# Patient Record
Sex: Male | Born: 1945 | Race: Black or African American | Hispanic: No | Smoking: Never smoker
Health system: Southern US, Community
[De-identification: ages and names within clinical notes are randomized; demographics above are authoritative.]

## PROBLEM LIST (undated history)

## (undated) DIAGNOSIS — E785 Hyperlipidemia, unspecified: Secondary | ICD-10-CM

## (undated) DIAGNOSIS — I639 Cerebral infarction, unspecified: Secondary | ICD-10-CM

## (undated) DIAGNOSIS — D649 Anemia, unspecified: Secondary | ICD-10-CM

## (undated) DIAGNOSIS — I10 Essential (primary) hypertension: Secondary | ICD-10-CM

## (undated) DIAGNOSIS — R471 Dysarthria and anarthria: Secondary | ICD-10-CM

## (undated) DIAGNOSIS — N289 Disorder of kidney and ureter, unspecified: Secondary | ICD-10-CM

## (undated) DIAGNOSIS — E871 Hypo-osmolality and hyponatremia: Secondary | ICD-10-CM

---
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
## 1999-06-09 ENCOUNTER — Emergency Department (HOSPITAL_COMMUNITY): Admission: EM | Admit: 1999-06-09 | Discharge: 1999-06-09 | Payer: Self-pay | Admitting: Emergency Medicine

## 1999-07-17 ENCOUNTER — Encounter: Admission: RE | Admit: 1999-07-17 | Discharge: 1999-08-02 | Payer: Self-pay | Admitting: Neurology

## 2004-04-12 ENCOUNTER — Inpatient Hospital Stay (HOSPITAL_COMMUNITY): Admission: EM | Admit: 2004-04-12 | Discharge: 2004-04-17 | Payer: Self-pay | Admitting: Emergency Medicine

## 2004-04-12 ENCOUNTER — Ambulatory Visit: Payer: Self-pay | Admitting: Neurosurgery

## 2004-04-17 ENCOUNTER — Ambulatory Visit: Payer: Self-pay | Admitting: Physical Medicine & Rehabilitation

## 2004-04-17 ENCOUNTER — Inpatient Hospital Stay
Admission: RE | Admit: 2004-04-17 | Discharge: 2004-04-26 | Payer: Self-pay | Admitting: Physical Medicine & Rehabilitation

## 2004-04-28 ENCOUNTER — Emergency Department (HOSPITAL_COMMUNITY): Admission: EM | Admit: 2004-04-28 | Discharge: 2004-04-28 | Payer: Self-pay

## 2004-05-31 ENCOUNTER — Ambulatory Visit: Payer: Self-pay | Admitting: Internal Medicine

## 2004-06-04 ENCOUNTER — Ambulatory Visit: Payer: Self-pay | Admitting: Internal Medicine

## 2004-06-04 ENCOUNTER — Ambulatory Visit: Payer: Self-pay | Admitting: *Deleted

## 2004-06-25 ENCOUNTER — Ambulatory Visit: Payer: Self-pay | Admitting: Internal Medicine

## 2004-08-06 ENCOUNTER — Ambulatory Visit: Payer: Self-pay | Admitting: Internal Medicine

## 2004-08-21 ENCOUNTER — Ambulatory Visit: Payer: Self-pay | Admitting: Internal Medicine

## 2004-09-18 ENCOUNTER — Ambulatory Visit: Payer: Self-pay | Admitting: Internal Medicine

## 2004-10-24 ENCOUNTER — Ambulatory Visit: Payer: Self-pay | Admitting: Internal Medicine

## 2004-11-19 ENCOUNTER — Ambulatory Visit: Payer: Self-pay | Admitting: Internal Medicine

## 2005-08-05 DIAGNOSIS — I639 Cerebral infarction, unspecified: Secondary | ICD-10-CM

## 2005-08-05 HISTORY — DX: Cerebral infarction, unspecified: I63.9

## 2006-03-07 ENCOUNTER — Ambulatory Visit: Payer: Self-pay | Admitting: Internal Medicine

## 2006-04-18 ENCOUNTER — Ambulatory Visit: Payer: Self-pay | Admitting: Internal Medicine

## 2006-05-13 ENCOUNTER — Ambulatory Visit: Payer: Self-pay | Admitting: Internal Medicine

## 2006-12-21 ENCOUNTER — Inpatient Hospital Stay (HOSPITAL_COMMUNITY): Admission: EM | Admit: 2006-12-21 | Discharge: 2006-12-31 | Payer: Self-pay | Admitting: Emergency Medicine

## 2007-01-23 ENCOUNTER — Inpatient Hospital Stay (HOSPITAL_COMMUNITY): Admission: EM | Admit: 2007-01-23 | Discharge: 2007-01-28 | Payer: Self-pay | Admitting: Emergency Medicine

## 2007-01-26 ENCOUNTER — Encounter (INDEPENDENT_AMBULATORY_CARE_PROVIDER_SITE_OTHER): Payer: Self-pay | Admitting: Orthopedic Surgery

## 2007-01-26 ENCOUNTER — Ambulatory Visit: Payer: Self-pay | Admitting: Vascular Surgery

## 2007-12-11 ENCOUNTER — Inpatient Hospital Stay (HOSPITAL_COMMUNITY): Admission: EM | Admit: 2007-12-11 | Discharge: 2007-12-19 | Payer: Self-pay | Admitting: Emergency Medicine

## 2007-12-16 ENCOUNTER — Encounter (INDEPENDENT_AMBULATORY_CARE_PROVIDER_SITE_OTHER): Payer: Self-pay | Admitting: Gastroenterology

## 2007-12-18 ENCOUNTER — Encounter (INDEPENDENT_AMBULATORY_CARE_PROVIDER_SITE_OTHER): Payer: Self-pay | Admitting: Gastroenterology

## 2010-08-26 ENCOUNTER — Encounter: Payer: Self-pay | Admitting: Physical Medicine & Rehabilitation

## 2010-12-18 NOTE — H&P (Signed)
Raymond Knight, SCHRIEBER NO.:  [id number]   MEDICAL RECORD NO.:  [id number]          PATIENT TYPE:  INP   LOCATION:  5032                         FACILITY:  [facility]   PHYSICIAN:  Della Goo, M.D. DATE OF BIRTH:  05/03/1946   DATE OF ADMISSION:  12/20/2006  DATE OF DISCHARGE:                              HISTORY & PHYSICAL   CHIEF COMPLAINT:  Right hip pain.   HISTORY OF PRESENT ILLNESS:  This is a 65 year old male who has a  history of a CVA with left-sided weakness who fell out of his wheelchair  in the evening and suffered right-sided hip pain.  The patient contacted  his daughter by cell phone and emergency medical services were notified.  The patient was taken to the hospital for evaluation. In the emergency  department, the patient had x-rays performed and found a right  intertrochanteric fracture.   PAST MEDICAL HISTORY:  1. Hypertension.  2. Cerebrovascular accident with left-sided hemiparesis.  3. Hyperlipidemia.   MEDICATIONS:  Include:  1. Clonidine 0.1 mg p.o. b.i.d.  2. Celexa 20 mg, one p.o. q.d.  3. Hydrochlorothiazide 25 mg, one p.o. q.d.  4. Lisinopril 20 mg, one p.o. q.d.  5. Lipitor 10 mg, one p.o. q.d.  6. Aspirin 325 mg, one p.o. q.d.   ALLERGIES:  THE PATIENT HAS NO KNOWN DRUG ALLERGIES.   SOCIAL HISTORY:  The patient lives at home, alone.  He is a nonsmoker,  nondrinker.   FAMILY HISTORY:  Noncontributory to this admission.   PHYSICAL EXAMINATION FINDINGS:  GENERAL:  An elderly, thin 65 year old  male in discomfort but no acute distress.  VITAL SIGNS:  Temperature 97.6, blood pressure 123/78, heart rate 100,  respirations 20, O2 saturation 100%.  HEENT EXAMINATION:  Normocephalic and atraumatic.  Pupils equal, round,  reactive to light.  Extraocular muscles are intact. Funduscopic benign.  Oropharynx is clear.  NECK:  Supple.  Full range of motion.  No thyromegaly, adenopathy, or  jugular venous distention.  CARDIOVASCULAR:   Regular rate and rhythm.  No murmurs, gallops or rubs.  LUNGS:  Clear to auscultation bilaterally.  ABDOMEN:  Positive bowel sounds, soft, nontender, nondistended.  EXTREMITIES:  Within clubbing, cyanosis, or edema.  NEUROLOGIC EXAMINATION:  The patient is alert and oriented x3.  His  speech is slurred from his previous CVA.  He has left-sided hemiparesis.  GENITOURINARY:  Deferred.  RECTAL:  Deferred.   LABORATORY STUDIES:  On admission, white blood cell count 11.6,  hemoglobin 10.6, hematocrit 31.5, platelets 217.  Sodium 137, potassium  4.2, chloride 104, BUN 25, creatinine 2.1.  MCV 88.9.  EKG reveals a  normal sinus rhythm without acute ST segment changes.   ASSESSMENT:  A 65 year old male with a previous cerebrovascular accident  being admitted with:  1. Right intertrochanteric fracture/right hip fracture.  2. Hypertension.  3. Hyperlipidemia.  4. Normocytic anemia.   PLAN:  The patient will be admitted for further evaluation and therapy.  Orthopedics will see the patient and make plans for surgical  intervention.  The patient will continue on his regular medications at  this time and  pain control therapy has been ordered.  The patient's  medications need to be verified since it appears that the patient is on  Lovastatin and Lipitor.  An anemia work up will also be ordered.  The  patient has been placed on GI and DVT prophylaxis.      Della Goo, M.D.  Electronically Signed     HJ/MEDQ  D:  12/28/2006  T:  12/28/2006  Job:  [id number]

## 2010-12-18 NOTE — H&P (Signed)
NAME:  Raymond Knight, Raymond Knight NO.:  [id number]   MEDICAL RECORD NO.:  [id number]          PATIENT TYPE:  EMS   LOCATION:  MAJO                         FACILITY:  [facility]   PHYSICIAN:  Michaelyn Barter, M.D. DATE OF BIRTH:  12/14/1945   DATE OF ADMISSION:  12/11/2007  DATE OF DISCHARGE:                              HISTORY & PHYSICAL   PRIMARY CARE PHYSICIAN:  Samara Snide, MD   CHIEF COMPLAINT:  Abnormal lab values.   HISTORY OF PRESENT ILLNESS:  Raymond Knight is a 65 year old male who  resides at [facility] nursing facility.  He has some delayed speaking  secondary to history of CVA and is therefore very difficult to obtain a  history from.  I attempted to call [facility] and spoke to a least 3 or 4  different individuals at the facility.  The  information that I was able  to gather was extremely limited according to Ms. Dartha Lodge, who identified  herself as a part-time nurse at the facility.  The patient had labs  completed today.  His hemoglobin was found to be 7.5.  His BUN was 100  and his creatinine 1.6.  He was subsequently sent to the hospital for  further evaluation of his lab values.  With regards to the patient's  overall clinical state at the facility, no individuals that I spoke with  could give me any information with regards to how the patient was doing  prior to his presentation to the hospital.   PAST MEDICAL HISTORY:  1. Left hip fracture.  2. CVA.  3. Acute blood loss anemia following surgical repair of the patient's      right hip.  4. Renal insufficiency.  5. Hyponatremia.  6. Left-sided hemiparesis secondary to the patient's CVA.  7. Dysarthria.  8. History of dysphagia following the patient's right cerebellar      hemorrhage.   PAST SURGICAL HISTORY:  1. Left hip intertrochanteric fracture open reduction internal      fixation completed by Dr. Dorene Knight on January 23, 2007.   ALLERGIES:  NO KNOWN DRUG ALLERGIES.   CURRENT  MEDICATIONS:  1. Coumadin 2.5 mg on Saturday, Coumadin 5 mg on Sunday.  2. Forteo 20 mg injection daily.  3. Os-Cal 500 mg p.o. t.i.d.  4. Vicodin 5/500 q.6 h p.r.n.  5. Celexa 20 mg daily.  6. Ferrous sulfate 325 mg one tablet b.i.d.  7. Senokot-S one tablet p.o. q.h.s.  8. Zantac 75 mg 2 tablets q.h.s.  9. Nicotine patch 21 mg q.24 h.   SOCIAL HISTORY:  Cigarettes have been denied in the past.  Alcohol  denied in the past.   FAMILY HISTORY:  Questionable.   REVIEW OF SYSTEMS:  As per HPI.   PHYSICAL EXAMINATION:  GENERAL:  The patient is awake.  He is  cooperative and in no obvious distress.  VITAL SIGNS:  Temperature is 97.4, blood pressure 106/66, heart rate  100, respirations 16, O2 sat 98%.  HEENT:  Normocephalic, atraumatic.  Anicteric.  Extraocular movements  are intact.  Oral mucosa is pink.  No thrush, no exudates.  NECK:  Supple.  No lymphadenopathy.  No JVD.  CARDIAC:  S1-S2 present.  Regular rate and rhythm.  RESPIRATORY:  No crackles or wheezes.  ABDOMEN:  Flat, soft, nontender, nondistended.  No masses palpated.  EXTREMITIES:  No leg edema.  NEUROLOGIC: The patient is alert and oriented x3.  MUSCULOSKELETAL:  Upper extremity strength 5/5.  Right leg strength is  approximately 3/5, and the patient had difficulties with regards to  moving his left lower extremity.   LABORATORY DATA:  PTT 78.  PT 44.9, INR 4.5.  Urinalysis negative.  Fecal occult blood was positive.  Sodium 141, potassium 3.4, chloride  107, CO2 23, BUN 104, creatinine 1.79, bilirubin total 0.7, alk phos  102, SGOT 23.  SGPT 35, total protein 7.6, albumin 3.1, calcium 10.6.  White blood cell count is 17, hemoglobin 8.8, hematocrit 25.6, platelets  692.   ASSESSMENT/PLAN:  1. Anemia.  The acuity of this is questionable.  The source is      questionable, although, in light of the patient's stools being      occult positive and the BUN being elevated, one has to be concerned      about the  possibility of a GI source.  Will transfuse the patient 2      units of packed red blood cells.  We will provide the patient with      Protonix.  We will consider a GI evaluation.  2. Supratherapeutic INR.  Will hold the patient's Coumadin and may      consider giving the patient vitamin K.  3. Hypokalemia.  Will supplement the patient's potassium.  4. Acute on chronic renal disease.  Will monitor the patient's BUN and      creatinine closely for now and gently hydrate the patient.  5. Hypoalbuminemia.  This may be associated protein calorie      malnutrition.  May consider consultation with one of the      dietitians.  6. History of hypertension.  The patient's blood pressure currently      appears to be stable.  7. GI prophylaxis.  Provide Protonix.  8. Code status:  A DNR out of facility form has been completed.  Will      honor the patient's DNR code status.      Michaelyn Barter, M.D.  Electronically Signed     OR/MEDQ  D:  12/11/2007  T:  12/11/2007  Job:  [id number]

## 2010-12-18 NOTE — Op Note (Signed)
NAMECRISTHIAN, Raymond Knight              ACCOUNT NO.:  [id number]   MEDICAL RECORD NO.:  [id number]          PATIENT TYPE:  INP   LOCATION:  5032                         FACILITY:  [facility]   PHYSICIAN:  Nadara Mustard, MD     DATE OF BIRTH:  [birth year]   DATE OF PROCEDURE:  12/21/2006  DATE OF DISCHARGE:                               OPERATIVE REPORT   PREOPERATIVE DIAGNOSIS:  Right intertrochanteric hip fracture.   POSTOPERATIVE DIAGNOSIS:  Right intertrochanteric hip fracture.   PROCEDURE:  IM nail, right intertrochanteric hip fracture; with a  Synthes nail 11 x 170 mm, 95 mm spiral blade, and a 36 mm distal  interlocking screw.   SURGEON.:  Nadara Mustard, MD.   ANESTHESIA:  General.   ESTIMATED BLOOD LOSS:  Minimal.   ANTIBIOTICS:  1 gram of Kefzol.   DRAINS:  None.   COMPLICATIONS:  None.   TOURNIQUET TIME:  None.   DISPOSITION:  To PACU in stable condition.   INDICATIONS FOR PROCEDURE:  The patient is a 65 year old gentleman who  fell from his walker or wheelchair sustaining a right intertrochanteric  hip fracture.  The patient was evaluated medically, felt to be safe for  surgical intervention.  He presents at this time for internal fixation  for the intertrochanteric hip fracture on the right.  Risks and benefits  were discussed with the patient including infection, neurovascular  injury, persistent pain, failure of hardware, need for additional  surgery as well as DVT and pulmonary embolus.  The patient states he  understands and wished to proceed at this time.   DESCRIPTION OF PROCEDURE:  The patient was brought to OR room #5 and  underwent a general anesthetic.  After adequate level of anesthesia  obtained, the patient was placed supine on the [facility] fracture table.  The peroneal post was padded and the right lower extremity was placed in  the boot traction.  This was padded and the left lower extremity was  placed in the dorsal lithotomy position.  The hip was  prepped using  DuraPrep and draped into a sterile field with a shower curtain.  C-arm  fluoroscopy was used for guidance.  The guidewire was inserted through  the greater trochanter, after a skin incision center-to-center down the  femoral canal.  This was then over-reamed with the 17-mm reamer.  The  nail was inserted and the C-arm was used for placement of the guidewire,  center-to-center in the femoral head and just inferior on the AP view;  this measured 95 mm.  The cortex was drilled and the 95-mm spiral blade  was placed; this was locked proximally.  Distally the guide was used and  a 36 mm locking screw was placed distally.  The guide was removed.  C-  arm fluoroscopy verified reduction on both AP and lateral planes.  The  wound was irrigated with normal saline.  The subcutaneous was closed  using 2-0 Vicryl.  The skin was closed using approximating staples.  The  wound was covered with Adaptic orthopedic sponges, ABD dressing and  Hypafix tape.  The patient was  extubated, taken to PACU in stable  condition.   PLAN:  For physical therapy, weightbearing as tolerated.  The patient  may need skilled nursing at discharge.      Nadara Mustard, MD  Electronically Signed     MVD/MEDQ  D:  12/21/2006  T:  12/21/2006  Job:  [id number]

## 2010-12-18 NOTE — Op Note (Signed)
NAME:  [name], [name] NO.:  [id number]   MEDICAL RECORD NO.:  [id number]          PATIENT TYPE:  INP   LOCATION:  5028                         FACILITY:  [facility]   PHYSICIAN:  Burnard Bunting, M.D.    DATE OF BIRTH:  26-Aug-1945   DATE OF PROCEDURE:  01/23/2007  DATE OF DISCHARGE:                               OPERATIVE REPORT   POSTOPERATIVE DIAGNOSIS:  Left hip intertrochanteric fracture.   POSTOPERATIVE DIAGNOSIS:  Left hip intertrochanteric fracture.   PROCEDURE:  Left hip intertrochanteric fracture open reduction and  internal fixation.   SURGEON:  Burnard Bunting, M.D.   ASSISTANT:  None.   ANESTHESIA:  General endotracheal.   ESTIMATED BLOOD LOSS:  100 mL.   DRAINS:  None.   INDICATION:  Raymond Knight is a 65 year old patient with a left hip  intertrochanteric fracture who is partially ambulatory who presents for  operative management after I explained the risks and benefits.   PROCEDURE IN DETAIL:  The patient was brought to the operating room  where general endotracheal anesthesia was induced.  Preoperative  antibiotics were administered.  The patient was placed on the [facility]  table with the right leg in lithotomy position with perineum well  padded.  Traction, internal rotation was applied to the left leg.  Reduction was confirmed in the AP and lateral planes and the area was  then cleaned with alcohol and Betadine and was then prepped with  DuraPrep and draped in a sterile manner.  Collier Flowers was used to cover the  operative field.  Topographical anatomy of the hip was identified  including the greater trochanter as well as the natural anteversion in  the course of the femur.  An incision was made perforating and resting  proximal to tip of the trochanter in line with the femur.  A guide pin  was placed through the tip of the trochanter past the lesser trochanter.  Correct localization was confirmed in the AP and lateral planes.  The  proximal femur  was then reamed with a starting reamer in accordance with  the preoperative templating.  A 10 x 40 IMHS nail was placed.  A lag  screw was then placed through a separate incision in the center-center  portion of the head.  A compression screw was also applied after the  traction was released.  Correct placement was confirmed in the AP and  lateral planes.  Excellent fracture  reduction was achieved.  Both incisions at this time were then irrigated  and closed using interrupted #1 Vicryl suture, interrupted 2-0 Vicryl  suture and skin staples with an impervious dressing.  The patient  tolerated the procedure well without immediate complication.      Burnard Bunting, M.D.  Electronically Signed     GSD/MEDQ  D:  01/24/2007  T:  01/24/2007  Job:  [id number]

## 2010-12-18 NOTE — Discharge Summary (Signed)
NAMESHONTEZ, SERMON NO.:  [id number]   MEDICAL RECORD NO.:  [id number]          PATIENT TYPE:  INP   LOCATION:  5028                         FACILITY:  [facility]   PHYSICIAN:  Burnard Bunting, M.D.    DATE OF BIRTH:  11-25-1945   DATE OF ADMISSION:  01/23/2007  DATE OF DISCHARGE:  01/29/2007                               DISCHARGE SUMMARY   DISCHARGE DIAGNOSES:  1. Left hip fracture.  2. History of cerebrovascular accident, getting paralysis and      hypertension.   OPERATIVE PROCEDURES:  Open reduction internal fixation, left hip  fracture; performed January 24, 2007.   HOSPITAL COURSE:  Raymond Knight is a partially ambulatory 65 year old  patient who sustained a right hip fracture about a month ago, and  sustained a left hip fracture the day he returned home from  rehabilitation.  The patient was taken to OR on January 24, 2007, at which  time he underwent open reduction internal fixation of the left hip  fracture.  He tolerated the procedure well without immediate  complication.  He was started on aspirin and mechanical prophylaxis for  DVT prophylaxis.  Ultrasound today showed bilateral lower extremity DVT.   On postoperative day #2, the patient is moved to physical therapy.  Touchdown weightbearing on the left lower extremity.  He is discharged  in good condition on January 29, 2007.   DISCHARGE MEDICATIONS:  1. Clonidine 0.1 mg p.o. b.i.d.  2. Celexa 20 mg p.o. daily.  3. Hydrochlorothiazide 25 mg p.o. daily.  4. Lisinopril 20 mg p.o. daily.  5. Simvastatin 20 mg p.o. daily.  6. Protonix 40 mg p.o. daily.  7. Aspirin 325 mg p.o. daily.  8. Percocet one p.o. q. 3-4 h. p.r.n. pain.   FOLLOWUP:  He will follow up with me in 7-10 days for suture removal.      Burnard Bunting, M.D.  Electronically Signed     GSD/MEDQ  D:  01/28/2007  T:  01/28/2007  Job:  [id number]

## 2010-12-18 NOTE — Consult Note (Signed)
NAME:  Raymond Knight, Raymond Knight NO.:  [id number]   MEDICAL RECORD NO.:  [id number]          PATIENT TYPE:  INP   LOCATION:  5123                         FACILITY:  [facility]   PHYSICIAN:  Petra Kuba, M.D.    DATE OF BIRTH:  10/29/1945   DATE OF CONSULTATION:  12/14/2007  DATE OF DISCHARGE:                                 CONSULTATION   We were asked to see Mr. Dorinda Hill today by Dr. Arthor Captain of [facility], team  D.   HISTORY OF PRESENT ILLNESS:  This is a 65 year old male who was admitted  with a hemoglobin of 7.5 and a BUN of 100.  He was on Coumadin at the  time of admission.  The patient reports no heartburn, no abdominal pain,  no melena, and no hematochezia.  He also tells me he does not want  endoscopy.  He also reports no NSAIDs.  The patient does have dysarthria  from a previous stroke and seems to answer questions very appropriately,  but it is still somewhat questionable if he fully comprehends what I am  asking him.  The patient does not currently want the meal at his  bedside.  I would be interested to know if he has lost weight recently.  He had an acute blood loss anemia episode just after the repair of his  left hip approximately 1 year ago.  His baseline hemoglobin appears to  have been about 10, MCV value is now 90.  His INR was 4.8 on admission.   PAST MEDICAL HISTORY:  Significant for both left and right hip fractures  with repair of the left hip, CVA with left-sided hemiparesis and  dysarthria as residual, history of acute blood loss anemia after right  hip repair, renal insufficiency, history of dysphagia after stroke, and  hypertension.   CURRENT MEDICATIONS:  Include Coumadin, Forteo, Os-Cal, Vicodin, Celexa,  ferrous sulfate, Senokot, Zantac, and nicotine patch.   ALLERGIES:  He has an allergy to CONTRAST MEDIA.   REVIEW OF SYSTEMS:  Significant for increased falling at the skilled  nursing facility.   SOCIAL HISTORY:  Negative for alcohol and  tobacco.   PHYSICAL EXAM:  GENERAL:  He is awake and appropriate.  VITAL SIGNS:  Temperature 98.4, pulse 91, respirations 20, and blood  pressure is 136/86.  HEART:  Regular rate and rhythm.  ABDOMEN:  Soft, nontender, and nondistended with active bowel sounds.   LABORATORY DATA:  BMET shows a potassium of 3.2, BUN 27, and creatinine  1.09.  White count is down from 17 to 14.4, hemoglobin is 10.5. 3.  The  MCV value of 98.3 is taken from his admission hemoglobin of 8.8,  hematocrit 31.3, and platelets 518,000.  PT 18.6, INR 1.5.  The patient  was guaiac positive on admission.   ASSESSMENT:  Dr. Vida Rigger has seen and examined the patient, collected  history and reviewed his chart.  His impression is that we really would  like more history, possibly from family members, although at this point  we are unable to reach them by the contact numbers in E-chart.  He  had a  supratherapeutic INR on admission.  He reports that he does not want  endoscopy.  He definitively needs a gastrointestinal workup including  endoscopy and  colonoscopy, can do these as an outpatient.  If we were able to  eliminate the Coumadin, his risk for gastrointestinal bleeding would  certainly decrease.  We will recommend continued PPI therapy with  continued stool softeners and attempt to do a gastrointestinal workup as  an outpatient.      Stephani Police, PA    ______________________________  Petra Kuba, M.D.    MLY/MEDQ  D:  12/14/2007  T:  12/15/2007  Job:  [id number]   cc:   Petra Kuba, M.D.  Michelene Gardener, MD  [facility]

## 2010-12-18 NOTE — Discharge Summary (Signed)
Raymond Knight, Raymond Knight              ACCOUNT NO.:  [id number]   MEDICAL RECORD NO.:  [id number]          PATIENT TYPE:  INP   LOCATION:  5123                         FACILITY:  [facility]   PHYSICIAN:  Isidor Holts, M.D.  DATE OF BIRTH:  05-23-1946   DATE OF ADMISSION:  12/10/2007  DATE OF DISCHARGE:  12/19/2007                               DISCHARGE SUMMARY   PRIMARY CARE PHYSICIAN:  [name], MD, [facility].   PRIMARY ORTHOPEDIC SURGEON:  Burnard Bunting, MD.   DISCHARGE DIAGNOSES:  1. Pelvic fracture.  2. Anemia likely secondary to acute blood loss and gastrointestinal      source, required blood transfusion.  3. Colon polyps revealed on colonoscopy Dec 18, 2007.  4. Dehydration/acute renal failure.  5. Hypernatremia.  6. Hypertension.  7. Aspiration pneumonia.  8. History of cerebrovascular accident with left hemiparesis,      dysarthria and dysphagia.  9. Coagulopathy secondary to Coumadin anticoagulation.   DISCHARGE MEDICATIONS:  1. Os-Cal 500 mg p.o. t.i.d.  2. Vicodin 5/500 mg one p.o. p.r.n. q.6 h.  3. Celexa 20 mg p.o. daily.  4. Ferrous sulfate 325 mg p.o. b.i.d.  5. Senokot-S one p.o. nightly.  6. Zantac 150 mg p.o. nightly.  7. Nicoderm CQ patch 21 mg/q.12 h., one patch to skin daily.  8. Ensure vanilla pudding 130 mL p.o. t.i.d.  9. Thick-It instant food thickener 227 g p.o. p.r.n.  10.Avelox 400 mg p.o. daily to be completed on Dec 23, 2007.   Note: Forteo injection and Coumadin have been discontinued.   PROCEDURES:  1. Chest x-ray dated Dec 11, 2007.  This showed the right greater than      left bibasilar airspace disease, most likely atelectasis.  Early      right lower lobe pneumonia cannot be excluded.  Underlying COPD.  2. X-ray hips, Dec 11, 2007.  This showed displaced left acetabular      fracture with extension through the left iliac bone.  3. Modified barium swallow examination dated Dec 14, 2007.  This      showed  mild oral, moderate pharyngeal dysphagia, aspiration with      thin liquid during swallow occurred due to decreased laryngeal      elevation and epiglottic closure.  The patient had immediate      spontaneous cough that did not clear from below vocal cords.      Delayed sensation resulted in late initiation of swallow to      valleculae and piriforms.  Trace vallecula and piriform sinus      residue caused by reduced tongue base retraction and laryngeal      elevation.  Oral dysphagia his characterized by decreased cohesion      and transit.  The patient's aspiration risk is moderate.      Recommendations:  D-3 dysphagia diet with nectar-thick liquids.  4. Chest x-ray dated Dec 16, 2007.  This showed appreciable      improvement in aeration at both bases with some mild residual      atelectasis bilaterally and some residual peribronchial thickening  at the right base.  The patient may have some bronchiectasis at the      right base.  5. Abdominal x-ray dated Dec 17, 2007.  This showed NG tube curled in      the fundus of the stomach.  6. Upper GI endoscopy dated Dec 16, 2007.  This showed tiny hiatal      hernia, tiny bulb polyp status post biopsy.  Otherwise normal EGD      without signs of bleeding.  7. Colonoscopy dated Dec 18, 2007.  This showed colon polyps.  These      were biopsied.   CONSULTATIONS:  Petra Kuba, MD, gastroenterologist.   ADMISSION HISTORY:  As in H&P notes of Dec 11, 2007, dictated by Dr.  Michaelyn Barter.  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  He was admitted for further  evaluation,  investigation and management.   CLINICAL COURSE:  1. Severe anemia.  The patient pre-admission, was on iron      supplementation for chronic anemia.  He now presents with a      reported hemoglobin of 7.5.  This was rechecked in the emergency      department, was found be 8.8 with a hematocrit of 25.6 and an MCV      of 90.3.  X-rays done as part of initial evaluation demonstrated a      new pelvic fracture.  Differential diagnostic considerations      included acute blood loss anemia versus possible chronic GI blood      loss.  The patient's anemia was addressed with transfusion of      packed red blood cells, resulting in a satisfactory bump in post-      transfusion hemoglobin to 11.5 on Dec 11, 2007.  The patient was      noted to have a coagulopathy with INR of 4.5.  This was addressed      with vitamin K supplementation.  GI consultation was called, which      was kindly provided by Dr. Vida Rigger.  For details of his      consultation, refer to consultation notes of Dec 14, 2007.  The      patient underwent upper GI endoscopy on Dec 16, 2007.  For      findings, refer to procedure list above.  No obvious source of      bleeding was seen although there was a tiny bulb polyp which was      biopsied.  He underwent lower GI endoscopy i.e., colonoscopy, on      Dec 18, 2007.  Colonic polyps were seen and were extracted.  Per      Dr. Vida Rigger, no further GI workup is indicated.  The patient      continues on iron supplementation.   1. Coagulopathy.  The patient pre-admission, was on Coumadin      anticoagulation.  Although extensive review of his electronic      medical records was done, no precise indication could be elicited.      He does have a propensity for falls and is considered at high fall  risk.  Coumadin has therefore been discontinued, particularly      against a background of his recent severe anemia.  INR was 4.5 at      the time of presentation.   This was successfully addressed with      vitamin K1 supplementation, and we are pleased to note that as of      Dec 18, 2007, INR was reasonable at 1.7.   1. Pelvic fracture.  As part of initial workup, the patient underwent      an x-ray of the pelvis on Dec 11, 2007.  For details, refer to      procedure list above.  However, this confirmed a displaced left      acetabular fracture with extension through the left iliac bone.      The patient was managed with weightbearing as tolerated, as well as      analgesic medication.  He was evaluated by PT/OT during the course      of this hospitalization and appropriate recommendations made.  We      recommend that the patient follow up with his primary orthopedic      surgeon, Dr. Dorene Grebe, following discharge.  He will likely      require repeat pelvic x-ray in approximately 2 weeks to document      interval union.   1. Dysphagia.  The patient has a known history of previous CVAs,      dysarthria and dysphagia.  He was evaluated by speech pathologist      during the course of this hospitalization, was found to have      oropharyngeal dysphagia and has been recommended D-3 dysphagia diet      with nectar-thick liquids.   1. Dehydration/acute renal failure/hypernatremia.  The patient at the      time of presentation was found to be dehydrated with markedly      elevated BUN/creatinine ratio.  Repeat laboratory testing confirmed      a BUN of 104 with a creatinine of 1.79.  This was deemed secondary      to dehydration and volume depletion.  He was managed with      intravenous fluid hydration with satisfactory clinical response.      We note that as of Dec 18, 2007, his renal indices have normalized      to a BUN of 18 and creatinine of 1.01.  Intravenous fluids have      therefore been discontinued.   1. Hypernatremia.  The patient did experience a hypernatremia of 151      on Dec 13, 2007.  This responded to hypotonic fluids, and we  are      pleased to note that as of Dec 18, 2007, serum sodium had      normalized at 137.   1. Aspiration pneumonia.  This is likely secondary to the patient's      dysphagia.  He was managed with a combination of Avelox and      Clindamycin.  Chest x-ray of Dec 16, 2007, confirmed interval      improvement of air space disease.  We were subsequently able to      transition him to oral Avelox on Dec 18, 2007, to complete a 10-day      course of treatment on Dec 23, 2007.  Throughout his      hospitalization, he was apyrexial.   1. Hypertension.  The patient remained normotensive during the course  of his hospitalization   DISPOSITION:  The patient was on Dec 18, 2007, considered sufficiently  clinically recovered and stable to be discharged back to the skilled  nursing facility on Dec 19, 2007, provided no acute problems arise in  the interim.   DIET:  Heart-healthy.   ACTIVITY:  As tolerated and per PT/OT.   WOUND CARE:  Not applicable.   FOLLOW-UP INSTRUCTIONS:  The patient is to follow up routinely with his  primary MD, Dr. Leodis Rains of [facility], per  prior scheduled appointment, but certainly within 1-2 weeks of  discharge.  In addition he is recommended to follow up with Dr. Dorene Grebe, his orthopedic surgeon, within 1-2 weeks to follow up his pelvic  fracture.   SPECIAL INSTRUCTIONS:  Continue PT/OT/rehab in the nursing facility  environment.  We also recommend x-ray of the pelvis in 2 weeks to  document interval union, or otherwise as indicated by orthopedic  surgeon, Dr. Dorene Grebe.  Of note, the patient is considered no longer a  candidate for Coumadin anticoagulation, secondary to recent to profound  anemia as well as high risk of falls.      Isidor Holts, M.D.  Electronically Signed     CO/MEDQ  D:  12/18/2007  T:  12/18/2007  Job:  [id number]   cc:   Samara Snide, MD  G. Dorene Grebe, M.D.

## 2010-12-18 NOTE — Discharge Summary (Signed)
Raymond Knight, Raymond Knight NO.:  [id number]   MEDICAL RECORD NO.:  [id number]          PATIENT TYPE:  INP   LOCATION:  5032                         FACILITY:  [facility]   PHYSICIAN:  Michaelyn Barter, M.D. DATE OF BIRTH:  [birth year]   DATE OF ADMISSION:  12/20/2006  DATE OF DISCHARGE:  12/31/2006                               DISCHARGE SUMMARY   FINAL DIAGNOSES:  1. Right intertrochanteric hip fracture.  2. Acute blood loss anemia.  3. Renal insufficiency.  4. Hyponatremia.   SECONDARY DIAGNOSIS:  History of cerebrovascular accident.   PROCEDURES:  1. Right intertrochanteric fracture with intramedullary nail placement      completed by Nadara Mustard, MD, on Dec 21, 2006.  2. Transfusion of 3 units of packed red blood cells.  3. X-ray of the right hip.  4. Portable chest x-ray completed Dec 23, 2006.  5. Portable chest x-ray completed Dec 21, 2006.  6. X-ray of the right femur completed Dec 21, 2006.   HISTORY OF PRESENT ILLNESS:  Raymond Knight is a 65 year old gentleman who  indicated that he fell out of his wheelchair on the evening of  admission, following which time he developed right hip pain.  The  patient was brought to the hospital for further evaluation.   For past medical history, please see that dictated by Della Goo,  M.D.   HOSPITAL COURSE:  Problem 1.  RIGHT INTERTROCHANTERIC HIP FRACTURE:  X-rays of the  patient's right femur were completed on Dec 21, 2006.  They revealed a  right femoral intertrochanteric fracture.  Orthopedic surgery was  consulted and on Dec 21, 2006, Dr. Aldean Baker performed an IM nail  procedure to repair the right intertrochanteric hip fracture.  The  patient appears to have tolerated the procedure well.  Over the past  several days of his hospitalization he has only complained of minimal  discomfort.   Problem 2.  ACUTE BLOOD LOSS ANEMIA:  The patient had a hemoglobin of  10.6 with a hematocrit of 31.5 at the time  of his admission.  However,  by May 20 his hemoglobin was noted to have dipped to 6.7.  He received  one unit of packed red blood cells.  By Dec 24, 2006, his hemoglobin was  noted to be 7.6.  For this he received 2 additional units of packed red  blood cells.  The patient's hemoglobin responded to the blood  transfusion.  By May 27 his hemoglobin was up to 9.2, which is slightly  below his baseline.  He was otherwise asymptomatic.   Problem 3.  SEVERE IRON DEFICIENCY:  The patient had an anemia panel  completed on Dec 21, 2006.  His iron level was noted to be less than 10.  The patient was started on ferrous sulfate 325 mg p.o. b.i.d.   Problem 4.  HYPERTENSION:  This has been relatively stable throughout  the course of the patient's hospitalization.   Condition at the time of discharge appears to be stable.  Currently the  patient has no complaints.  His vitals:  His temperature is 98.9, heart  rate 68, respirations 18, blood pressure 133/87, O2 saturation 98% on  room air.  The patient will be discharged from the hospital.   His medications at the time of discharge are:  1. Ecotrin 325 mg p.o. daily.  2. Celexa 20 mg p.o. daily.  3. Clonidine 0.1 mg p.o. b.i.d.  4. Ferrous sulfate 325 mg p.o. b.i.d.  5. Lopressor 12.5 mg p.o. b.i.d.  6. Protonix 40 mg p.o. daily.      Michaelyn Barter, M.D.  Electronically Signed     OR/MEDQ  D:  12/31/2006  T:  12/31/2006  Job:  [id number]

## 2010-12-18 NOTE — Op Note (Signed)
NAME:  Raymond Knight, Raymond Knight              ACCOUNT NO.:  [id number]   MEDICAL RECORD NO.:  [id number]          PATIENT TYPE:  INP   LOCATION:  5123                         FACILITY:  [facility]   PHYSICIAN:  Petra Kuba, M.D.    DATE OF BIRTH:  [birth year]   DATE OF PROCEDURE:  12/18/2007  DATE OF DISCHARGE:                               OPERATIVE REPORT   PROCEDURE:  Colonoscopy with biopsy.   INDICATION:  Guaiac-positive anemia, nondiagnostic endo.  Consent was  signed after risks, benefits, and management options were thoroughly  discussed with myself and the PA prior to any sedation.   MEDICINES USED:  Fentanyl 50 mcg and Versed 4 mg.   PROCEDURE IN DETAIL:  Rectal inspection confirmed small external  hemorrhoids.  Digital exam was negative.  Video pediatric colonoscope  was inserted and fairly easily advanced from the colon to the cecum.  No  signs of bleeding were seen.  We did need some abdominal pressure, but  no position changes.  Cecum was identified by the appendiceal orifice  and the ileocecal valve.  The scope was slowly withdrawn.  The cecum in  the ascending were normal.  The prep was adequate.  There was some  liquid stool that required washing and suctioning and in the more  proximal transverse, a small pedunculated polyp was seen.  Snare  electrocautery applied and followed with suction through the scope and  collected in the trap.  Two other possible tiny transverse polyps were  seen.  However, due to increased spasm, had difficulty getting the scope  in position to biopsy them, and based on other medical problems and the  tiny nature of these, we elected to withdrawal.  No other abnormalities  were seen and no signs of bleeding as we slowly withdrew back to the  rectum.  In the rectum, two tiny hyperplastic-appearing polyps were seen  in cold biopsy and put in the same container.  Anorectal pull-through  and retroflexion revealed some tiny hemorrhoids.  Scope was drained  and  readvanced towards the left side, the colon air was suctioned and scope  was removed.  The patient tolerated the procedure well.  There was no  obvious immediate complications.   ENDOSCOPIC DIAGNOSES:  1. Internal and external small hemorrhoids.  2. Two rectal tiny polyps with cold biopsy.  3. Proximal small transverse polyps snared.  4. Two tiny transverse polyps, not biopsied secondary to seen but      unable to re-find and rebiopsy due to an increased spasm.  5. Otherwise, within normal limits to the cecum.   PLAN:  Await pathology, if doing well medically or stable.  Consider  repeat colonoscopy in 5 years.  Happy to see back p.r.n.  Slowly  advanced diet.  No further GI workup at this time.          ______________________________  Petra Kuba, M.D.    MEM/MEDQ  D:  12/18/2007  T:  12/19/2007  Job:  [id number]   cc:   [facility] SERVICE

## 2010-12-18 NOTE — Op Note (Signed)
NAME:  Raymond Knight, Raymond Knight              ACCOUNT NO.:  [id number]   MEDICAL RECORD NO.:  [id number]          PATIENT TYPE:  INP   LOCATION:  5123                         FACILITY:  [facility]   PHYSICIAN:  Petra Kuba, M.D.    DATE OF BIRTH:  October 10, 1945   DATE OF PROCEDURE:  12/16/2007  DATE OF DISCHARGE:                               OPERATIVE REPORT   PROCEDURE:  EGD.   INDICATIONS:  Anemia, guaiac positivity.  Consent was signed after  risks, benefits, methods, and options thoroughly discussed with the  patient yesterday and today prior to sedation.   MEDICINES USED:  1. Fentanyl 75 mcg.  2. Versed 7 mg.   PROCEDURE:  Video endoscope was inserted by direct vision.  The  esophagus was normal.  He did have a tiny hiatal hernia.  Scope passed  in the stomach advanced through a normal antrum, normal pylorus into the  duodenal bulb where a tiny polyp was seen and advanced around the C-loop  to a normal second portion of the duodenum.  No blood was seen distally.  Scope was slowly withdrawn back to the bulb, and other than the tiny  polyp, no other abnormalities were seen there.  Two biopsies were  obtained of the polyp.  Scope was withdrawn back to the stomach and  retroflexed.  Angularis, cardia, fundus, lesser and greater curves were  evaluated on retroflex and straight visualization which was normal.  Air  was suctioned.  Scope was slowly withdrawn.  A good look at the  esophagus again confirmed above normal findings.  Scope was removed.  The patient tolerated the procedure well.  There was no obvious  immediate complication.   ENDOSCOPIC DIAGNOSES:  1. Tiny hiatal hernia.  2. Tiny bulb polyp status post biopsy.  3. Otherwise normal EGD without signs of bleeding.   PLAN:  Await pathology.  We will need to discuss colon with the patient.  It will be difficult to the prep him.  Probably, he will need an NG tube  since I do not know how he tolerates clear liquids, never on the  laxatives.  We will discuss that when he wakes up later today.           ______________________________  Petra Kuba, M.D.     MEM/MEDQ  D:  12/16/2007  T:  12/16/2007  Job:  [id number]

## 2010-12-18 NOTE — Consult Note (Signed)
NAME:  Raymond Knight, Raymond Knight NO.:  [id number]   MEDICAL RECORD NO.:  [id number]          PATIENT TYPE:  INP   LOCATION:  5028                         FACILITY:  [facility]   PHYSICIAN:  Burnard Bunting, M.D.    DATE OF BIRTH:  1946/06/29   DATE OF CONSULTATION:  01/24/2007  DATE OF DISCHARGE:                                 CONSULTATION   CHIEF COMPLAINT:  Left hip pain.   HISTORY OF PRESENT ILLNESS:  Raymond Knight is a 65 year old patient  with left hip pain.  He is actually 3 weeks status post right hip  fracture, treated with open reduction internal fixation.  He had an  acute fall today while he was at the nursing home while he was  attempting to ambulate.  He was using a walker at the time.  He denies  any loss of consciousness, or any other orthopedic complaints.  His  granddaughter is with him here today, and she is providing the history.   PAST MEDICAL HISTORY:  Notable for CVA, giving him left-sided  hemiparalysis, as well as hypertension.  He is a nonsmoker.   SOCIAL HISTORY:  Noncontributory.   ALLERGIES:  The patient has no known drug allergies.   MEDICATIONS:  Include:  1. Clonidine 0.1 mg p.o. b.i.d.  2. Celexa 20 mg p.o. daily.  3. Hydrochlorothiazide 25 mg p.o. daily.  4. Lisinopril 20 mg p.o. daily.  5. Lipitor 10 mg p.o. daily.  6. ASA 325 mg p.o. daily.  7. Protonix 40 mg p.o. daily.   PHYSICAL EXAMINATION:  VITAL SIGNS:  His blood pressure is 141/84, pulse  is 84, respirations 20, O2 sat is 99% on room air, temperature is 98.1.  CHEST:  Clear to auscultation.  HEART:  Regular rate and rhythm.  ABDOMINAL:  Benign.  SKIN:  He has no lymphadenopathy.  No mass or other skin changes noted  in the upper or lower extremities.  MUSCULOSKELETAL:  His left hip has some pain with internal and external  rotation.  He has very little voluntary movement of the left side.  DP  pulse 2+/4.  He has no real grinding or crepitus with passive range of  motion of  bilateral wrists, elbows and shoulders.  There is no knee  effusion bilaterally.  His right hip has minimal pain with internal and  external rotation.   Radiographs do show an acute left intertrochanteric fracture.  Chest x-  ray shows no acute disease.  EKG is pending.  A sodium and potassium 131  and 4.7, BUN and creatinine is 14 and 1.05, glucose is 110.  White count  is 11, hematocrit is 31, platelets 311.   IMPRESSION:  Left hip intertrochanteric fracture with mild hyponatremia.   PLAN:  Open reduction internal fixation risks and benefits were  discussed with the patient and his daughter.  All questions answered.      Burnard Bunting, M.D.  Electronically Signed     GSD/MEDQ  D:  01/23/2007  T:  01/24/2007  Job:  [id number]

## 2010-12-21 NOTE — H&P (Signed)
NAME:  Raymond Knight, Raymond Knight NO.:  [id number]   MEDICAL RECORD NO.:  [id number]                   PATIENT TYPE:  INP   LOCATION:  1827                                 FACILITY:  [facility]   PHYSICIAN:  Payton Doughty, M.D.                   DATE OF BIRTH:  26-Nov-1945   DATE OF ADMISSION:  04/12/2004  DATE OF DISCHARGE:                                HISTORY & PHYSICAL   ADMITTING DIAGNOSIS:  Right cerebellar hemorrhage.   HISTORY:  [age]-year-old right-handed black gentleman with the onset of  slurred speech yesterday and was brought to the emergency room today by  family.  CT showed a right cerebellar hemorrhage, approximately 3 x 3 cm,  with some compression of the fourth ventricle, but not obstruction of it and  nor is there any hydrocephalus.  He has been awake and alert, and just  states he feels bad.   PAST MEDICAL HISTORY:  Medical history is remarkable for a stroke five years  ago.   PAST SURGICAL HISTORY:  None.   ALLERGIES:  None.   MEDICATIONS:  None.  He is suppose to take an antihypertensive, but he does  not.   SOCIAL HISTORY:  There is no drinking, but he does smoke.   FAMILY HISTORY:  Family history is negative.   REVIEW OF SYSTEMS:  The patient states nothing   PHYSICAL EXAMINATION:  GENERAL APPEARANCE:  The patient is awake and feels  bad.  VITAL SIGNS: Blood pressure is 190/100 and pulse is in the 80s.  HEENT:  Exam is within normal limits.  He does have arcus senilis.  NECK:  Neck is supple with no lymphadenopathy.  CHEST:  Chest has diffuse crackles.  HEART:  Cardiac examination is regular rate and rhythm without a murmur.  ABDOMEN:  Abdomen is soft and has positive bowel sounds.  EXTREMITIES:  Extremities are without clubbing or cyanosis.  He has positive  pulses throughout.  GENITOURINARY:  GU exam is deferred.  EXTREMITIES:  Peripheral pulses are good.  NEUROLOGIC EXAMINATION:  Neurologically he is alert, awake and  oriented  times three.  Pupils equal, round and react to light.  Extraocular movements  are intact.  Facial movements and sensation appear to be intact.  Palate  elevates symmetrically, but he is dysarthric.  Shoulder shrug is equal.  Motor exam shows full strength without pronator draft; however, he is  dysmetria in both the right upper and lower extremities on finger-to-nose,  pointing and heel-to-shin.  Reflexes are 1 throughout, save n the ankles,  which are absent.  His toes are downgoing bilaterally.   LABORATORY DATA:  CT is as above.   CLINICAL IMPRESSION:  Cerebellar hemorrhage without hydrocephalus.  This is  likely hypertensive in nature.   1.  The patient needs to have his blood pressure controlled.  2.  The patient will remain in the hospital ________ with  him.  3.  Rescan him in the morning.   I think he is as bad as he is going to get and will probably recover from  here.                                                Payton Doughty, M.D.    MWR/MEDQ  D:  04/12/2004  T:  04/13/2004  Job:  [id number]

## 2010-12-21 NOTE — Discharge Summary (Signed)
NAMEMAKSIM, PEREGOY              ACCOUNT NO.:  [id number]   MEDICAL RECORD NO.:  [id number]          PATIENT TYPE:  INP   LOCATION:  3109                         FACILITY:  [facility]   PHYSICIAN:  Payton Doughty, M.D.      DATE OF BIRTH:  Sep 09, 1945   DATE OF ADMISSION:  04/12/2004  DATE OF DISCHARGE:  04/17/2004                                 DISCHARGE SUMMARY   ADMITTING DIAGNOSES:  Cerebellar hemorrhage.   A 65 year old gentleman whose history and physical is recounted in the  chart.  Brought to the emergency room September 8 with CT showed right  cerebellar hemorrhage.  No obstruction and no hydrocephalus.  Medical  history is remarkable for a stroke.   ALLERGIES:  None.   MEDICATIONS:  None.   SOCIAL HISTORY:  Smokes.   PHYSICAL EXAMINATION:  GENERAL:  Intact.  NEUROLOGIC:  Intact save for right upper extremity dysmetria and mild  dysarthria.   He was admitted to the ICU and observed.  He had really no change.  There  was no hydrocephalus.  He was mobilized in physical therapy and occupational  therapy who felt he would benefit from a stay on the rehabilitation service.  He was transferred to the rehabilitation service April 17, 2004 still  slightly dysmetric on the right.  His follow-up will be in the [facility] office on a p.r.n. basis.       MWR/MEDQ  D:  07/10/2004  T:  07/10/2004  Job:  [id number]

## 2010-12-21 NOTE — Discharge Summary (Signed)
Raymond Knight, Raymond Knight NO.:  [id number]   MEDICAL RECORD NO.:  [id number]          PATIENT TYPE:  ORB   LOCATION:  4524                         FACILITY:  [facility]   PHYSICIAN:  Ellwood Dense, M.D.   DATE OF BIRTH:  1946-03-12   DATE OF ADMISSION:  04/17/2004  DATE OF DISCHARGE:  04/26/2004                                 DISCHARGE SUMMARY   DISCHARGE DIAGNOSES:  1.  Right cerebellar hemorrhage.  2.  Hypertension with poor compliance.  3.  Renal insufficiency, improved.  4.  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  5.  History of cerebrovascular accident five years ago.   HISTORY OF PRESENT ILLNESS:  This is a 65 year old right-handed black male  admitted September 8 with slurred speech, blood pressure 190, diastolic 100.  Cranial CT scan with right cerebellar hemorrhage 3 x 3 cm with some  compression of the fourth ventricle without hydrocephalus.  Neurosurgery,  Dr. Trey Sailors.  Conservative care advised.  Follow-up cranial CT scan with  stable large cerebellar hemorrhage.  Blood pressure still with fluctuating  variables with clonidine and lisinopril.  Mild hypokalemia 2.9 and  supplemented.  He was admitted for a comprehensive rehabilitation program.   PAST MEDICAL HISTORY:  See discharge diagnoses.   ALLERGIES:  None.   HABITS:  No alcohol.  Remote smoker.   MEDICATIONS ON ADMISSION:  No medication prior to admission with noted poor  compliance for his hypertension in the past.   SOCIAL HISTORY:  Lives alone in [place].  One-level home.  No steps to  entry.  He is divorced.   HOSPITAL COURSE:  Patient with slow progressive gains while on  rehabilitation services with therapies initiated daily.  The following  issues were followed during patient's rehabilitation course.  Pertaining to  Raymond Knight's right cerebellar hemorrhage, remained stable with follow-up  cranial CT scans, conservative care per Dr. Trey Sailors of neurosurgery.  He  had some  mild slurred speech, but was fully intelligible.  Followed by  speech therapy for dysphagia after cerebellar hemorrhage initially on a  regular diet with nectar thick liquids.  After follow-up per speech therapy,  strong encourage for fluids.  Poor overall intake.  Initial admit  laboratories showed renal function to be within normal limits with BUN 7,  creatinine 1.0 on September 14.  Routine follow-up laboratories September 21  with dramatic increased creatinine of 4.0.  Thus, he was placed on half  normal IV fluids.  September 21 creatinine improved to 2.7.  In the interim  a modified barium swallow was completed.  He was now advanced to a regular  diet with full thin liquids.  His overall intake had greatly improved once  his diet was advanced.  His intravenous fluids were discontinued September  22 with plans for placement for skilled nursing facility.  Blood pressures  monitored with clonidine and lisinopril.  He had no bowel or bladder  disturbances.  He had completed an early course of Avelox for pneumonia  while in the acute side of the hospital.  This had been since completed.  A  follow-up chest  x-ray September 21 showed improved right lower lobe  pneumonia.  Overall for his functional status he was supervision bed  mobility and transfers, minimal guard ambulation with a rolling walker  needing QCUES for safety.  Due to the fact that patient lived alone with  limited assistance, it was felt a skilled nursing facility was needed with a  bed becoming available on September 22 and discharge taking place at that  time.   Latest laboratories showed a hemoglobin 12.6, hematocrit 37.2, platelets  344,000, WBC of 7000.  Sodium 134, potassium 4.2, BUN 44, creatinine 2.7  after intravenous fluids with max creatinine of 4.0.   DISCHARGE MEDICATIONS:  1.  Clonidine 0.2 mg p.o. b.i.d.  2.  Lisinopril 10 p.o. daily.  3.  Multivitamin daily.  4.  Tylenol as needed.   ACTIVITY:  As  tolerated.  Supervision for safety.   DIET:  Regular.   SPECIAL INSTRUCTIONS:  Continue to monitor electrolytes for mild renal  insufficiency with encouragement of fluids.       DA/MEDQ  D:  04/26/2004  T:  04/26/2004  Job:  [id number]   cc:   Payton Doughty, M.D.  [address]  [place]  [place] [address]  Fax: [phone]

## 2010-12-21 NOTE — Discharge Summary (Signed)
NAMEJAVONNI, Raymond Knight              ACCOUNT NO.:  [id number]   MEDICAL RECORD NO.:  [id number]          PATIENT TYPE:  ORB   LOCATION:  4524                         FACILITY:  [facility]   PHYSICIAN:  Mariam Dollar, P.A.  DATE OF BIRTH:  March 15, 1946   DATE OF ADMISSION:  04/17/2004  DATE OF DISCHARGE:  04/26/2004                                 DISCHARGE SUMMARY   ADDENDUM:  Discharge had been set for nursing home placement to take place  on April 26, 2004.  However, during the previous evening his ex-wife had  presented that she could now provide the necessary supervision at home and  the patient was accepting of that.  Thus, a prior discharge summary had  already been dictated for upcoming discharge to nursing home.  This has been  changed now to discharge home with his ex-wife.  Case management had  assisted in finding the patient a primary M.D. and priority summary would  now be faxed to that presenting doctor to follow.       DA/MEDQ  D:  04/26/2004  T:  04/26/2004  Job:  [id number]

## 2011-05-01 LAB — PROTIME-INR: Prothrombin Time: 20.5 — ABNORMAL HIGH

## 2011-05-01 LAB — COMPREHENSIVE METABOLIC PANEL
ALT: 27
CO2: 23
Calcium: 8.4
Chloride: 107
Creatinine, Ser: 1.01
GFR calc non Af Amer: >60
Glucose, Bld: 95
Sodium: 137
Total Bilirubin: 1

## 2011-05-01 LAB — CBC
Hemoglobin: 10.2 — ABNORMAL LOW
MCHC: 34.3
MCV: 90.3
RBC: 3.29 — ABNORMAL LOW
WBC: 9.3

## 2011-05-01 LAB — HEMOGLOBIN AND HEMATOCRIT, BLOOD: HCT: 30.4 — ABNORMAL LOW

## 2011-05-22 LAB — CROSSMATCH
Antibody Screen: NEGATIVE
Antibody Screen: NEGATIVE

## 2011-05-22 LAB — CBC
HCT: 21.8 — ABNORMAL LOW
HCT: 24.2 — ABNORMAL LOW
HCT: 31.3 — ABNORMAL LOW
Hemoglobin: 10.5 — ABNORMAL LOW
Hemoglobin: 7.4 — CL
Hemoglobin: 8.2 — ABNORMAL LOW
MCHC: 33.7
MCHC: 33.8
MCV: 88.2
MCV: 88.6
Platelets: 311
RBC: 2.48 — ABNORMAL LOW
RBC: 2.76 — ABNORMAL LOW
RBC: 3.53 — ABNORMAL LOW
RDW: 13.5
RDW: 13.6
RDW: 14
WBC: 11.1 — ABNORMAL HIGH

## 2011-05-22 LAB — BASIC METABOLIC PANEL
Calcium: 8.8
Chloride: 96
GFR calc Af Amer: >60
GFR calc Af Amer: >60
GFR calc non Af Amer: >60
GFR calc non Af Amer: >60
Glucose, Bld: 124 — ABNORMAL HIGH
Potassium: 4.3
Potassium: 4.7
Sodium: 130 — ABNORMAL LOW

## 2011-05-22 LAB — BASIC METABOLIC PANEL WITH GFR
BUN: 14
CO2: 27
Calcium: 9.6
Creatinine, Ser: 1.05
Glucose, Bld: 110 — ABNORMAL HIGH
Sodium: 131 — ABNORMAL LOW

## 2011-05-22 LAB — DIFFERENTIAL
Basophils Absolute: 0
Basophils Relative: 0
Eosinophils Absolute: 0.1
Eosinophils Relative: 1
Lymphocytes Relative: 9 — ABNORMAL LOW
Lymphs Abs: 0.9
Monocytes Absolute: 0.9 — ABNORMAL HIGH
Monocytes Relative: 8
Neutro Abs: 9.2 — ABNORMAL HIGH
Neutrophils Relative %: 83 — ABNORMAL HIGH

## 2011-05-22 LAB — PROTIME-INR
INR: 1
Prothrombin Time: 12.9

## 2011-05-22 LAB — SAMPLE TO BLOOD BANK

## 2019-02-18 ENCOUNTER — Emergency Department (HOSPITAL_COMMUNITY): Payer: Medicare Other

## 2019-02-18 ENCOUNTER — Emergency Department (HOSPITAL_COMMUNITY)
Admission: EM | Admit: 2019-02-18 | Discharge: 2019-02-19 | Disposition: A | Discharge status: Home / Self Care | Payer: Medicare Other | Attending: Emergency Medicine | Admitting: Emergency Medicine

## 2019-02-18 ENCOUNTER — Other Ambulatory Visit: Payer: Self-pay

## 2019-02-18 ENCOUNTER — Encounter (HOSPITAL_COMMUNITY): Payer: Self-pay

## 2019-02-18 DIAGNOSIS — R531 Weakness: Secondary | ICD-10-CM | POA: Diagnosis not present

## 2019-02-18 DIAGNOSIS — F17210 Nicotine dependence, cigarettes, uncomplicated: Secondary | ICD-10-CM | POA: Diagnosis not present

## 2019-02-18 LAB — URINALYSIS, ROUTINE W REFLEX MICROSCOPIC
Bilirubin Urine: NEGATIVE
Glucose, UA: NEGATIVE mg/dL
Ketones, ur: NEGATIVE mg/dL
Leukocytes,Ua: NEGATIVE
Nitrite: NEGATIVE
Protein, ur: NEGATIVE mg/dL
Specific Gravity, Urine: 1.003 — ABNORMAL LOW (ref 1.005–1.030)
pH: 6 (ref 5.0–8.0)

## 2019-02-18 LAB — BASIC METABOLIC PANEL
Anion gap: 11 (ref 5–15)
BUN: 11 mg/dL (ref 8–23)
CO2: 26 mmol/L (ref 22–32)
Calcium: 9.4 mg/dL (ref 8.9–10.3)
Chloride: 103 mmol/L (ref 98–111)
Creatinine, Ser: 1.01 mg/dL (ref 0.61–1.24)
GFR calc Af Amer: >60 mL/min (ref >60)
GFR calc non Af Amer: >60 mL/min (ref >60)
Glucose, Bld: 99 mg/dL (ref 70–99)
Potassium: 3.4 mmol/L — ABNORMAL LOW (ref 3.5–5.1)
Sodium: 140 mmol/L (ref 135–145)

## 2019-02-18 LAB — CBC
HCT: 35.2 % — ABNORMAL LOW (ref 39.0–52.0)
Hemoglobin: 11.1 g/dL — ABNORMAL LOW (ref 13.0–17.0)
MCH: 28.8 pg (ref 26.0–34.0)
MCHC: 31.5 g/dL (ref 30.0–36.0)
MCV: 91.2 fL (ref 80.0–100.0)
Platelets: 247 10*3/uL (ref 150–400)
RBC: 3.86 MIL/uL — ABNORMAL LOW (ref 4.22–5.81)
RDW: 13.7 % (ref 11.5–15.5)
WBC: 6.5 10*3/uL (ref 4.0–10.5)
nRBC: 0 % (ref 0.0–0.2)

## 2019-02-18 NOTE — ED Notes (Signed)
Patient transported to CT 

## 2019-02-18 NOTE — ED Notes (Signed)
Patient transported to X-ray 

## 2019-02-18 NOTE — ED Triage Notes (Signed)
Per [facility], pt from home w/ a c/o generalized weakness and a fall from a chair. The pt was found on the floor this morning by family. The fall was not witnessed. It is unknown if there was a LOC. Pt reports sliding out of the chair. No neuro deficits. Pt denies pain or injury. Strong smell of odor noted on pt with urinary incontinence. Baseline mental status unknown. Currently AOx3 (not alert to time).   Pedal edema noted. Family reports this is normal.

## 2019-02-18 NOTE — ED Provider Notes (Addendum)
[facility] Provider Note   CSN: [id number] Arrival date & time: 02/18/19  1744     History   Chief Complaint Chief Complaint  Patient presents with   Weakness    HPI Bartholomew Boardsndrew F Busick is a 73 y.o. male.     HPI Patient is a 73 year old male who is brought to the emergency department after being found on the floor this morning by family.  This was noted witnessed fall.  Patient reports that he slid out of his chair.  Patient has no focus complaints at this time.  He is alert and oriented to person and place but not date.  No family available by phone at this time.  Patient without complaints at this time.    History reviewed. No pertinent past medical history.  There are no active problems to display for this patient.   History reviewed. No pertinent surgical history.      Home Medications    Prior to Admission medications   Not on File    Family History History reviewed. No pertinent family history.  Social History Social History   Tobacco Use   Smoking status: Current Every Day Smoker   Smokeless tobacco: Never Used  Substance Use Topics   Alcohol use: Never    Frequency: Never   Drug use: Never     Allergies   Patient has no allergy information on record.   Review of Systems Review of Systems  All other systems reviewed and are negative.    Physical Exam Updated Vital Signs BP (!) 147/81    Pulse 75    Temp 98.7 F (37.1 C) (Oral)    Resp 17    SpO2 100%   Physical Exam Vitals signs and nursing note reviewed.  Constitutional:      Appearance: He is well-developed.  HENT:     Head: Normocephalic and atraumatic.  Neck:     Musculoskeletal: Normal range of motion.  Cardiovascular:     Rate and Rhythm: Normal rate and regular rhythm.     Heart sounds: Normal heart sounds.  Pulmonary:     Effort: Pulmonary effort is normal. No respiratory distress.     Breath sounds: Normal breath sounds.    Abdominal:     General: There is no distension.     Palpations: Abdomen is soft.     Tenderness: There is no abdominal tenderness.  Musculoskeletal: Normal range of motion.     Comments: Full range of motion of bilateral hips, knees, ankles.  Full range of motion bilateral shoulders, elbows, wrists  Skin:    General: Skin is warm and dry.  Neurological:     General: No focal deficit present.     Mental Status: He is alert. Mental status is at baseline.     Comments: Able to raise his arms bilaterally.  Grip strength equal bilaterally.  Able to range his lower legs on his own.  He is able to lift each foot off the bed for several seconds.  Psychiatric:        Judgment: Judgment normal.      ED Treatments / Results  Labs (all labs ordered are listed, but only abnormal results are displayed) Labs Reviewed  CBC - Abnormal; Notable for the following components:      Result Value   RBC 3.86 (*)    Hemoglobin 11.1 (*)    HCT 35.2 (*)    All other components within normal limits  BASIC  METABOLIC PANEL - Abnormal; Notable for the following components:   Potassium 3.4 (*)    All other components within normal limits  URINALYSIS, ROUTINE W REFLEX MICROSCOPIC - Abnormal; Notable for the following components:   Color, Urine COLORLESS (*)    Specific Gravity, Urine 1.003 (*)    Hgb urine dipstick SMALL (*)    Bacteria, UA RARE (*)    All other components within normal limits    EKG EKG Interpretation  Date/Time:  Thursday February 18 2019 17:55:26 EDT Ventricular Rate:  85 PR Interval:    QRS Duration: 94 QT Interval:  369 QTC Calculation: 439 R Axis:   37 Text Interpretation:  Sinus rhythm Biatrial enlargement Borderline T abnormalities, inferior leads No significant change was found Confirmed by Azalia Bilisampos, Eniola Cerullo ([id number]) on 02/18/2019 9:20:09 PM   Radiology Dg Pelvis 1-2 Views  Result Date: 02/18/2019 CLINICAL DATA:  Fall, generalized weakness EXAM: PELVIS - 1-2 VIEW COMPARISON:   None. FINDINGS: Hardware noted with in the proximal femurs bilaterally likely related to old bilateral rib fractures. Diffuse osteopenia. Moderate degenerative changes within the left hip. 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 No visible acute fracture. IMPRESSION: Posttraumatic changes within the hips bilaterally. 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 No visible acute bony abnormality. Osteopenia. Electronically Signed   By: Charlett NoseKevin  Dover M.D.   On: 02/18/2019 22:39   Ct Head Wo Contrast  Result Date: 02/18/2019 CLINICAL DATA:  Muscle weakness, generalized weakness and fall from chair. Unwitnessed fall. EXAM: CT HEAD WITHOUT CONTRAST TECHNIQUE: Contiguous axial images were obtained from the base of the skull through the vertex without intravenous contrast. COMPARISON:  CT 04/13/2004 FINDINGS: Brain: There are regions of remote infarction in the bilateral cerebellar hemispheres, left basal ganglia and right corona radiata. No evidence of acute infarction, hemorrhage, hydrocephalus, extra-axial collection or mass lesion/mass effect. Patchy areas of white matter hypoattenuation are most compatible with chronic microvascular angiopathy. Symmetric prominence of the ventricles, cisterns and sulci compatible with parenchymal volume loss. Vascular: Atherosclerotic calcification of the carotid siphons and intradural vertebral arteries. No hyperdense vessel. Skull: No calvarial fracture or suspicious osseous lesion. No scalp swelling or hematoma. Sinuses/Orbits: Orbital structures are unremarkable. Minimal nodular mural thickening in the paranasal sinuses. No air-fluid levels mastoids are well aerated. Other: None. IMPRESSION: No acute intracranial abnormality. No scalp swelling calvarial fracture. Areas of remote infarction in the cerebellum, left basal ganglia and right corona radiata. Volume loss and chronic microvascular ischemic changes. Electronically Signed   By: Kreg ShropshirePrice  DeHay M.D.   On:  02/18/2019 23:13    Procedures Procedures (including critical care time)  Medications Ordered in ED Medications - No data to display   Initial Impression / Assessment and Plan / ED Course  I have reviewed the triage vital signs and the nursing notes.  Pertinent labs & imaging results that were available during my care of the patient were reviewed by me and considered in my medical decision making (see chart for details).        7:45 PM I attempted to call the patient's daughter twice and both times the phone went straight to voicemail.  9:26 PM Attempted to call daughter again x2 without answer.  Patient is overall well-appearing.  His vital signs are stable.  His work-up here in the emergency department is without significant abnormality.  He has no complaints at this time.  Full range of motion of major joints.  Note occasion for additional work-up at this time.  Primary  care follow-up.  Patient instructed to return to the ER for new or worsening symptoms  Patient appears deconditioned.  Consult case management.  Home health resources will be provided for increasing strength at home.  Primary care follow-up.  No indication for additional work-up at this time.  Case discussed with patient's daughter  Final Clinical Impressions(s) / ED Diagnoses   Final diagnoses:  Weakness    ED Discharge Orders    None       Jola Schmidt, MD 02/18/19 2130    Jola Schmidt, MD 02/18/19 2326

## 2019-02-19 ENCOUNTER — Telehealth: Payer: Self-pay | Admitting: *Deleted

## 2019-02-19 NOTE — Telephone Encounter (Signed)
TOC CM attempted to reach patient to arrange [facility], The, number is not working. Attempted all to dtr, pt's emergency contact. Unable to leave message. Jonnie Finner RN CCM Case Mgmt phone [phone]

## 2019-02-19 NOTE — Telephone Encounter (Signed)
TOC CM contacted pt and no answer. Contacted dtr's number and left HIPAA compliant voice mail to return call. Referral to arrange [facility]. Jonnie Finner RN CCM Case Mgmt phone [phone]

## 2019-04-07 ENCOUNTER — Emergency Department (HOSPITAL_COMMUNITY): Payer: Medicare Other

## 2019-04-07 ENCOUNTER — Inpatient Hospital Stay (HOSPITAL_COMMUNITY)
Admission: EM | Admit: 2019-04-07 | Discharge: 2019-04-15 | DRG: 064 | Disposition: A | Discharge status: Skilled Nursing Facility | Payer: Medicare Other | Attending: Internal Medicine | Admitting: Internal Medicine

## 2019-04-07 DIAGNOSIS — R490 Dysphonia: Secondary | ICD-10-CM | POA: Diagnosis present

## 2019-04-07 DIAGNOSIS — I69351 Hemiplegia and hemiparesis following cerebral infarction affecting right dominant side: Secondary | ICD-10-CM

## 2019-04-07 DIAGNOSIS — Z8673 Personal history of transient ischemic attack (TIA), and cerebral infarction without residual deficits: Secondary | ICD-10-CM

## 2019-04-07 DIAGNOSIS — R8290 Unspecified abnormal findings in urine: Secondary | ICD-10-CM | POA: Diagnosis present

## 2019-04-07 DIAGNOSIS — I6381 Other cerebral infarction due to occlusion or stenosis of small artery: Principal | ICD-10-CM | POA: Diagnosis present

## 2019-04-07 DIAGNOSIS — R64 Cachexia: Secondary | ICD-10-CM | POA: Diagnosis present

## 2019-04-07 DIAGNOSIS — I619 Nontraumatic intracerebral hemorrhage, unspecified: Secondary | ICD-10-CM | POA: Diagnosis present

## 2019-04-07 DIAGNOSIS — R29810 Facial weakness: Secondary | ICD-10-CM | POA: Diagnosis present

## 2019-04-07 DIAGNOSIS — R296 Repeated falls: Secondary | ICD-10-CM | POA: Diagnosis present

## 2019-04-07 DIAGNOSIS — F17200 Nicotine dependence, unspecified, uncomplicated: Secondary | ICD-10-CM | POA: Diagnosis present

## 2019-04-07 DIAGNOSIS — E870 Hyperosmolality and hypernatremia: Secondary | ICD-10-CM | POA: Diagnosis present

## 2019-04-07 DIAGNOSIS — R4789 Other speech disturbances: Secondary | ICD-10-CM | POA: Diagnosis not present

## 2019-04-07 DIAGNOSIS — I639 Cerebral infarction, unspecified: Secondary | ICD-10-CM

## 2019-04-07 DIAGNOSIS — I739 Peripheral vascular disease, unspecified: Secondary | ICD-10-CM | POA: Diagnosis present

## 2019-04-07 DIAGNOSIS — E785 Hyperlipidemia, unspecified: Secondary | ICD-10-CM | POA: Diagnosis present

## 2019-04-07 DIAGNOSIS — J984 Other disorders of lung: Secondary | ICD-10-CM | POA: Diagnosis present

## 2019-04-07 DIAGNOSIS — E860 Dehydration: Secondary | ICD-10-CM | POA: Diagnosis present

## 2019-04-07 DIAGNOSIS — R471 Dysarthria and anarthria: Secondary | ICD-10-CM | POA: Diagnosis present

## 2019-04-07 DIAGNOSIS — Z20828 Contact with and (suspected) exposure to other viral communicable diseases: Secondary | ICD-10-CM | POA: Diagnosis present

## 2019-04-07 DIAGNOSIS — R4182 Altered mental status, unspecified: Secondary | ICD-10-CM

## 2019-04-07 DIAGNOSIS — G9341 Metabolic encephalopathy: Secondary | ICD-10-CM | POA: Diagnosis present

## 2019-04-07 DIAGNOSIS — I10 Essential (primary) hypertension: Secondary | ICD-10-CM | POA: Diagnosis present

## 2019-04-07 HISTORY — DX: Essential (primary) hypertension: I10

## 2019-04-07 HISTORY — DX: Hypo-osmolality and hyponatremia: E87.1

## 2019-04-07 HISTORY — DX: Dysarthria and anarthria: R47.1

## 2019-04-07 HISTORY — DX: Disorder of kidney and ureter, unspecified: N28.9

## 2019-04-07 HISTORY — DX: Anemia, unspecified: D64.9

## 2019-04-07 HISTORY — DX: Hyperlipidemia, unspecified: E78.5

## 2019-04-07 LAB — COMPREHENSIVE METABOLIC PANEL
ALT: 62 U/L — ABNORMAL HIGH (ref 0–44)
AST: 67 U/L — ABNORMAL HIGH (ref 15–41)
Albumin: 3.5 g/dL (ref 3.5–5.0)
Alkaline Phosphatase: 66 U/L (ref 38–126)
Anion gap: 12 (ref 5–15)
BUN: 53 mg/dL — ABNORMAL HIGH (ref 8–23)
CO2: 27 mmol/L (ref 22–32)
Calcium: 10.5 mg/dL — ABNORMAL HIGH (ref 8.9–10.3)
Chloride: 113 mmol/L — ABNORMAL HIGH (ref 98–111)
Creatinine, Ser: 1.17 mg/dL (ref 0.61–1.24)
GFR calc Af Amer: >60 mL/min (ref >60)
GFR calc non Af Amer: >60 mL/min (ref >60)
Glucose, Bld: 100 mg/dL — ABNORMAL HIGH (ref 70–99)
Potassium: 4.1 mmol/L (ref 3.5–5.1)
Sodium: 152 mmol/L — ABNORMAL HIGH (ref 135–145)
Total Bilirubin: 0.9 mg/dL (ref 0.3–1.2)
Total Protein: 8.1 g/dL (ref 6.5–8.1)

## 2019-04-07 LAB — CBC WITH DIFFERENTIAL/PLATELET
Abs Immature Granulocytes: 0.08 10*3/uL — ABNORMAL HIGH (ref 0.00–0.07)
Basophils Absolute: 0 10*3/uL (ref 0.0–0.1)
Basophils Relative: 0 %
Eosinophils Absolute: 0 10*3/uL (ref 0.0–0.5)
Eosinophils Relative: 0 %
HCT: 41.4 % (ref 39.0–52.0)
Hemoglobin: 12.6 g/dL — ABNORMAL LOW (ref 13.0–17.0)
Immature Granulocytes: 1 %
Lymphocytes Relative: 8 %
Lymphs Abs: 0.9 10*3/uL (ref 0.7–4.0)
MCH: 27.8 pg (ref 26.0–34.0)
MCHC: 30.4 g/dL (ref 30.0–36.0)
MCV: 91.4 fL (ref 80.0–100.0)
Monocytes Absolute: 1.2 10*3/uL — ABNORMAL HIGH (ref 0.1–1.0)
Monocytes Relative: 10 %
Neutro Abs: 9.3 10*3/uL — ABNORMAL HIGH (ref 1.7–7.7)
Neutrophils Relative %: 81 %
Platelets: 238 10*3/uL (ref 150–400)
RBC: 4.53 MIL/uL (ref 4.22–5.81)
RDW: 13.9 % (ref 11.5–15.5)
WBC: 11.5 10*3/uL — ABNORMAL HIGH (ref 4.0–10.5)
nRBC: 0 % (ref 0.0–0.2)

## 2019-04-07 MED ORDER — SODIUM CHLORIDE 0.9 % IV BOLUS
500.0000 mL | Freq: Once | INTRAVENOUS | Status: AC
  Administered 2019-04-07: 500 mL via INTRAVENOUS

## 2019-04-07 NOTE — ED Notes (Signed)
Pt responding appropriately to yes/no questions.  Pt nods appropriately and follows commands.  RN asked pt if he usually speaks at home, to which he shook his head no.  Pt denies pain at this time.

## 2019-04-07 NOTE — ED Notes (Signed)
Pt understands and follows commands. When given time, he will respond with yes and no responses.

## 2019-04-07 NOTE — ED Provider Notes (Addendum)
73 year old male received a signout from Dr. Alvino Chapel pending labs.  Per his HPI:  "Level 5 caveat due to difficulty speaking. HPI Patient reportedly brought in because daughter called 911.  Reportedly yesterday went to see father and he was not himself.  Only able to speak minimally and speak quietly.  Patient states that normally he would be able to speak much more but does not speak normally due to previous stroke.  Patient states he does not hurt anywhere.  Really cannot provide much history.  I called the patient's daughter Antoinetta and she states that there was no recent fall.  States that he had difficulty speaking.  Normally walks with a walker however. No past medical history on file.  There are no active problems to display for this patient.   No past surgical history on file."     Physical Exam  BP 140/83   Pulse (!) 120   Temp 97.7 F (36.5 C) (Oral)   Resp 15   Wt 49.7 kg   SpO2 99%   Physical Exam Vitals signs and nursing note reviewed.  Constitutional:      General: He is not in acute distress.    Appearance: He is well-developed.     Comments: Chronically ill-appearing thin male  HENT:     Head: Atraumatic.  Eyes:     Pupils: Pupils are equal, round, and reactive to light.  Neck:     Musculoskeletal: Normal range of motion and neck supple.     Trachea: No tracheal deviation.  Cardiovascular:     Rate and Rhythm: Normal rate.  Pulmonary:     Effort: Pulmonary effort is normal. No respiratory distress.  Abdominal:     General: There is no distension.     Palpations: Abdomen is soft.  Musculoskeletal: Normal range of motion.  Skin:    General: Skin is warm and dry.     ED Course/Procedures     Procedures  MDM   73 year old male received at signout from Dr. Alvino Chapel pending labs.  Please see his note for further work-up and medical decision making.  Labs are notable for hypernatremia of 152.  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  However, after placing order, notified by nursing staff that the patient has pulled out his IV and is awaiting IV access by IV team.    Consult to the hospitalist team for admission and Dr. Posey Pronto will accept. The patient appears reasonably stabilized for admission considering the current resources, flow, and capabilities available in the ED at this time, and I doubt any other [facility] requiring further screening and/or treatment in the ED prior to admission.      Joline Maxcy A, PA-C 04/08/19 0301    Davonna Belling, MD 04/08/19 2246

## 2019-04-07 NOTE — ED Notes (Signed)
Patient transported to CT 

## 2019-04-07 NOTE — ED Triage Notes (Signed)
Pt BIBA from home.  Per EMs- Pt fell at home at some point today, wife unclear as to exact time.  Wife found him on the ground beside the bed, pt has abrasions to face and knees. Pt arrived with C collar in place. Pt grimacing in pain, not verbally answering questions.

## 2019-04-07 NOTE — ED Notes (Addendum)
Skin removed in several spots on upper left leg, lateral side. Two areas of skin removed on left great toe. One area of skin removed from top of foot. Skin removed on left knee. Skin removed on right great toe.

## 2019-04-07 NOTE — ED Notes (Signed)
Spoke with Pts daughter Alease Frame [phone]) who reported- I was called over to the house yesterday by his wife "he's not doing well", he couldn't speak, he seemed like he was gasping for air.  I tried to get them to take him to the hospital yesterday and she wouldn't allow him to go.  Daughter reports that she has contacted Adult protective services.  Daughter reports that she last spoke to her dad about a week and a half ago.   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   Daughter would like an update if/when able.

## 2019-04-07 NOTE — ED Provider Notes (Signed)
[facility] DEPT Provider Note   CSN: [id number] Arrival date & time: 04/07/19  1630     History   Chief Complaint No chief complaint on file.   HPI Raymond Knight is a 73 y.o. male.    Level 5 caveat due to difficulty speaking. HPI Patient reportedly brought in because daughter called 911.  Reportedly yesterday went to see father and he was not himself.  Only able to speak minimally and speak quietly.  Patient states that normally he would be able to speak much more but does not speak normally due to previous stroke.  Patient states he does not hurt anywhere.  Really cannot provide much history.  I called the patient's daughter Antoinetta and she states that there was no recent fall.  States that he had difficulty speaking.  Normally walks with a walker however. No past medical history on file.  There are no active problems to display for this patient.   No past surgical history on file.      Home Medications    Prior to Admission medications   Not on File    Family History No family history on file.  Social History Social History   Tobacco Use   Smoking status: Current Every Day Smoker   Smokeless tobacco: Never Used  Substance Use Topics   Alcohol use: Never    Frequency: Never   Drug use: Never     Allergies   Patient has no known allergies.   Review of Systems Review of Systems  Unable to perform ROS: Mental status change     Physical Exam Updated Vital Signs BP (!) 141/83 (BP Location: Right Arm)    Pulse (!) 110    Temp 97.7 F (36.5 C) (Oral)    Resp 16    SpO2 96%   Physical Exam Vitals signs and nursing note reviewed.  HENT:     Head: Atraumatic.     Mouth/Throat:     Mouth: Mucous membranes are moist.  Eyes:     Extraocular Movements: Extraocular movements intact.  Cardiovascular:     Rate and Rhythm: Tachycardia present.     Comments: Mild tachycardia Pulmonary:     Breath sounds: No  wheezing, rhonchi or rales.  Abdominal:     Tenderness: There is no abdominal tenderness.  Musculoskeletal:        General: No tenderness.  Skin:    General: Skin is warm.  Neurological:     Mental Status: He is alert.     Comments: Patient will nod yes or no but is only minimally verbal.  Appears somewhat contracted on all extremities.      ED Treatments / Results  Labs (all labs ordered are listed, but only abnormal results are displayed) Labs Reviewed  SARS CORONAVIRUS 2 ([facility])  URINALYSIS, ROUTINE W REFLEX MICROSCOPIC  COMPREHENSIVE METABOLIC PANEL  CBC WITH DIFFERENTIAL/PLATELET    EKG EKG Interpretation  Date/Time:  Wednesday April 07 2019 20:05:35 EDT Ventricular Rate:  103 PR Interval:    QRS Duration: 100 QT Interval:  360 QTC Calculation: 472 R Axis:   76 Text Interpretation:  Sinus tachycardia Multiform ventricular premature complexes RAE, consider biatrial enlargement Probable left ventricular hypertrophy Borderline T abnormalities, inferior leads Confirmed by Davonna Belling [phone]) on 04/07/2019 9:58:05 PM   Radiology Ct Head Wo Contrast  Result Date: 04/07/2019 CLINICAL DATA:  Patient status post fall today.  Initial encounter. EXAM: CT HEAD  WITHOUT CONTRAST TECHNIQUE: Contiguous axial images were obtained from the base of the skull through the vertex without intravenous contrast. COMPARISON:  Head CT scan 02/18/2019. FINDINGS: Brain: No evidence of acute infarction, hemorrhage, hydrocephalus, extra-axial collection or mass lesion/mass effect. Atrophy, chronic microvascular ischemic change, remote bilateral cerebellar infarcts and bilateral basal ganglia infarcts all again seen. Vascular: Extensive atherosclerosis.  No hyperdense vessel. Skull: Intact.  No focal lesion. Sinuses/Orbits: Mucous retention cyst or polyp left maxillary sinus noted. Other: None. IMPRESSION: No acute intracranial abnormality. Atrophy,  chronic microvascular ischemic change remote infarcts as described above. Extensive atherosclerosis. Electronically Signed   By: Drusilla Kannerhomas  Dalessio M.D.   On: 04/07/2019 21:17   Dg Chest Portable 1 View  Result Date: 04/07/2019 CLINICAL DATA:  Altered mental status EXAM: PORTABLE CHEST 1 VIEW COMPARISON:  12/16/2007 FINDINGS: Hyperinflated lungs. No focal airspace disease or effusion. Normal cardiomediastinal silhouette. No pneumothorax. Stable right apically pleural and parenchymal scarring. IMPRESSION: No active disease.  Pulmonary hyperinflation Electronically Signed   By: Jasmine PangKim  Fujinaga M.D.   On: 04/07/2019 19:47    Procedures Procedures (including critical care time)  Medications Ordered in ED Medications  sodium chloride 0.9 % bolus 500 mL (has no administration in time range)     Initial Impression / Assessment and Plan / ED Course  I have reviewed the triage vital signs and the nursing notes.  Pertinent labs & imaging results that were available during my care of the patient were reviewed by me and considered in my medical decision making (see chart for details).        Patient generalized weakness difficulty speaking and fatigue.  Discussed with patient's daughter normal able walk with a walker and speak almost normally.  Can do neither now.  Head CT and chest x-ray reassuring.  Rest of labs pending.  Will likely require admission the hospital.  Care turned over to oncoming provider  Final Clinical Impressions(s) / ED Diagnoses   Final diagnoses:  Weakness    ED Discharge Orders    None       Benjiman CorePickering, Caidyn Henricksen, MD 04/07/19 2201

## 2019-04-07 NOTE — ED Notes (Signed)
ED Provider at bedside. 

## 2019-04-08 ENCOUNTER — Encounter (HOSPITAL_COMMUNITY): Payer: Self-pay | Admitting: Internal Medicine

## 2019-04-08 ENCOUNTER — Observation Stay (HOSPITAL_COMMUNITY): Payer: Medicare Other

## 2019-04-08 DIAGNOSIS — I69351 Hemiplegia and hemiparesis following cerebral infarction affecting right dominant side: Secondary | ICD-10-CM | POA: Diagnosis not present

## 2019-04-08 DIAGNOSIS — F17200 Nicotine dependence, unspecified, uncomplicated: Secondary | ICD-10-CM | POA: Diagnosis present

## 2019-04-08 DIAGNOSIS — I639 Cerebral infarction, unspecified: Secondary | ICD-10-CM

## 2019-04-08 DIAGNOSIS — E870 Hyperosmolality and hypernatremia: Secondary | ICD-10-CM | POA: Diagnosis present

## 2019-04-08 DIAGNOSIS — R4182 Altered mental status, unspecified: Secondary | ICD-10-CM

## 2019-04-08 DIAGNOSIS — I361 Nonrheumatic tricuspid (valve) insufficiency: Secondary | ICD-10-CM | POA: Diagnosis not present

## 2019-04-08 DIAGNOSIS — Z20828 Contact with and (suspected) exposure to other viral communicable diseases: Secondary | ICD-10-CM | POA: Diagnosis present

## 2019-04-08 DIAGNOSIS — R64 Cachexia: Secondary | ICD-10-CM | POA: Diagnosis present

## 2019-04-08 DIAGNOSIS — I10 Essential (primary) hypertension: Secondary | ICD-10-CM | POA: Diagnosis present

## 2019-04-08 DIAGNOSIS — I739 Peripheral vascular disease, unspecified: Secondary | ICD-10-CM | POA: Diagnosis present

## 2019-04-08 DIAGNOSIS — R296 Repeated falls: Secondary | ICD-10-CM | POA: Diagnosis present

## 2019-04-08 DIAGNOSIS — R29810 Facial weakness: Secondary | ICD-10-CM | POA: Diagnosis present

## 2019-04-08 DIAGNOSIS — J984 Other disorders of lung: Secondary | ICD-10-CM | POA: Diagnosis present

## 2019-04-08 DIAGNOSIS — I6381 Other cerebral infarction due to occlusion or stenosis of small artery: Secondary | ICD-10-CM | POA: Diagnosis present

## 2019-04-08 DIAGNOSIS — E785 Hyperlipidemia, unspecified: Secondary | ICD-10-CM | POA: Diagnosis not present

## 2019-04-08 DIAGNOSIS — R4789 Other speech disturbances: Secondary | ICD-10-CM | POA: Diagnosis present

## 2019-04-08 DIAGNOSIS — E860 Dehydration: Secondary | ICD-10-CM | POA: Diagnosis not present

## 2019-04-08 DIAGNOSIS — R8290 Unspecified abnormal findings in urine: Secondary | ICD-10-CM | POA: Diagnosis present

## 2019-04-08 DIAGNOSIS — Z8673 Personal history of transient ischemic attack (TIA), and cerebral infarction without residual deficits: Secondary | ICD-10-CM

## 2019-04-08 DIAGNOSIS — G9341 Metabolic encephalopathy: Secondary | ICD-10-CM | POA: Diagnosis present

## 2019-04-08 DIAGNOSIS — I619 Nontraumatic intracerebral hemorrhage, unspecified: Secondary | ICD-10-CM | POA: Diagnosis present

## 2019-04-08 DIAGNOSIS — R471 Dysarthria and anarthria: Secondary | ICD-10-CM | POA: Diagnosis present

## 2019-04-08 DIAGNOSIS — R490 Dysphonia: Secondary | ICD-10-CM | POA: Diagnosis present

## 2019-04-08 LAB — URINALYSIS, MICROSCOPIC (REFLEX)

## 2019-04-08 LAB — CBC
HCT: 38.2 % — ABNORMAL LOW (ref 39.0–52.0)
Hemoglobin: 11.7 g/dL — ABNORMAL LOW (ref 13.0–17.0)
MCH: 28.4 pg (ref 26.0–34.0)
MCHC: 30.6 g/dL (ref 30.0–36.0)
MCV: 92.7 fL (ref 80.0–100.0)
Platelets: 260 10*3/uL (ref 150–400)
RBC: 4.12 MIL/uL — ABNORMAL LOW (ref 4.22–5.81)
RDW: 13.9 % (ref 11.5–15.5)
WBC: 9.8 10*3/uL (ref 4.0–10.5)
nRBC: 0 % (ref 0.0–0.2)

## 2019-04-08 LAB — URINALYSIS, ROUTINE W REFLEX MICROSCOPIC
Bilirubin Urine: NEGATIVE
Glucose, UA: NEGATIVE mg/dL
Nitrite: NEGATIVE
Protein, ur: 30 mg/dL — AB
Specific Gravity, Urine: 1.025 (ref 1.005–1.030)
pH: 6 (ref 5.0–8.0)

## 2019-04-08 LAB — RENAL FUNCTION PANEL
Albumin: 3.1 g/dL — ABNORMAL LOW (ref 3.5–5.0)
Anion gap: 12 (ref 5–15)
BUN: 48 mg/dL — ABNORMAL HIGH (ref 8–23)
CO2: 28 mmol/L (ref 22–32)
Calcium: 9.8 mg/dL (ref 8.9–10.3)
Chloride: 104 mmol/L (ref 98–111)
Creatinine, Ser: 1.07 mg/dL (ref 0.61–1.24)
GFR calc Af Amer: >60 mL/min (ref >60)
GFR calc non Af Amer: >60 mL/min (ref >60)
Glucose, Bld: 160 mg/dL — ABNORMAL HIGH (ref 70–99)
Phosphorus: 3.4 mg/dL (ref 2.5–4.6)
Potassium: 3.9 mmol/L (ref 3.5–5.1)
Sodium: 144 mmol/L (ref 135–145)

## 2019-04-08 LAB — GLUCOSE, CAPILLARY: Glucose-Capillary: 102 mg/dL — ABNORMAL HIGH (ref 70–99)

## 2019-04-08 LAB — SARS CORONAVIRUS 2 BY RT PCR (HOSPITAL ORDER, PERFORMED IN ~~LOC~~ HOSPITAL LAB): SARS Coronavirus 2: NEGATIVE

## 2019-04-08 MED ORDER — DEXTROSE 5 % IV SOLN: Freq: Once | INTRAVENOUS | Status: DC

## 2019-04-08 MED ORDER — SENNOSIDES-DOCUSATE SODIUM 8.6-50 MG PO TABS: 1.0000 | ORAL_TABLET | Freq: Every evening | ORAL | Status: DC | PRN

## 2019-04-08 MED ORDER — ENOXAPARIN SODIUM 40 MG/0.4ML ~~LOC~~ SOLN
40.0000 mg | Freq: Every day | SUBCUTANEOUS | Status: DC
  Filled 2019-04-08 (×2): qty 0.4

## 2019-04-08 MED ORDER — STROKE: EARLY STAGES OF RECOVERY BOOK
Freq: Once | Status: AC
  Administered 2019-04-08: 22:00:00
  Filled 2019-04-08 (×2): qty 1

## 2019-04-08 MED ORDER — ACETAMINOPHEN 325 MG PO TABS
650.0000 mg | ORAL_TABLET | ORAL | Status: DC | PRN
  Administered 2019-04-15: 650 mg via ORAL
  Filled 2019-04-08: qty 2

## 2019-04-08 MED ORDER — ACETAMINOPHEN 160 MG/5ML PO SOLN: 650.0000 mg | ORAL | Status: DC | PRN

## 2019-04-08 MED ORDER — SODIUM CHLORIDE 0.9% FLUSH
3.0000 mL | Freq: Two times a day (BID) | INTRAVENOUS | Status: DC
  Administered 2019-04-08: 02:00:00 3 mL via INTRAVENOUS

## 2019-04-08 MED ORDER — ACETAMINOPHEN 325 MG PO TABS
650.0000 mg | ORAL_TABLET | Freq: Four times a day (QID) | ORAL | Status: DC | PRN
  Administered 2019-04-08: 650 mg via ORAL
  Filled 2019-04-08: qty 2

## 2019-04-08 MED ORDER — DEXTROSE 5 % IV SOLN
INTRAVENOUS | Status: AC
  Administered 2019-04-08: 02:00:00 via INTRAVENOUS

## 2019-04-08 MED ORDER — ACETAMINOPHEN 650 MG RE SUPP
650.0000 mg | Freq: Four times a day (QID) | RECTAL | Status: DC | PRN
  Filled 2019-04-08: qty 1

## 2019-04-08 MED ORDER — ASPIRIN EC 81 MG PO TBEC
81.0000 mg | DELAYED_RELEASE_TABLET | Freq: Every day | ORAL | Status: DC
  Administered 2019-04-10 – 2019-04-15 (×6): 81 mg via ORAL
  Filled 2019-04-08 (×7): qty 1

## 2019-04-08 MED ORDER — ACETAMINOPHEN 650 MG RE SUPP: 650.0000 mg | RECTAL | Status: DC | PRN

## 2019-04-08 NOTE — ED Notes (Signed)
Dr. Grandville Silos paged at patient's daughter request. Patient's daughter would like an update on patient's results, including MRI results.

## 2019-04-08 NOTE — Progress Notes (Signed)
Person from [employer] came and wanted to talk to patient's nurse.  Found nurse Leafy Ro), and told her that the person wanted to talk to her.  I informed the lady from Adult [employer] that the nurse would be with her shortly.  A few minutes later, the person from Adult [employer] wanted to speak to a Education officer, museum so I went to get the number from the Biomedical engineer.  I gave the lady from Adult [employer] Services the number to the ED Social Worker.  She thanked me and left.

## 2019-04-08 NOTE — ED Notes (Signed)
Pt's condom cath leaked. Changed pt and applied new condom cath

## 2019-04-08 NOTE — ED Notes (Signed)
ED TO INPATIENT HANDOFF REPORT  Name/Age/Gender Raymond Knight 73 y.o. male  Code Status    Code Status Orders  (From admission, onward)         Start     Ordered   04/08/19 0118  Full code  Continuous     04/08/19 0119        Code Status History    This patient has a current code status but no historical code status.   Advance Care Planning Activity      Home/SNF/Other Home  Chief Complaint Fall  Level of Care/Admitting Diagnosis ED Disposition    ED Disposition Condition Comment   Admit  [facility] [[id number]]  Level of Care: Telemetry Cardiac [103]  Covid Evaluation: Confirmed COVID Negative  Diagnosis: CVA (cerebral vascular accident) [facility](HCC) [[id number]]) [[id number]]  Admitting Physician: Rodolph BongHOMPSON, DANIEL V [[id number]]  Attending Physician: Rodolph BongHOMPSON, DANIEL V [[id number]]  Estimated length of stay: past midnight tomorrow  Certification:: I certify this patient will need inpatient services for at least 2 midnights  PT Class (Do Not Modify): Inpatient [101]  PT Acc Code (Do Not Modify): Private [1]       Medical History Past Medical History:  Diagnosis Date  . Anemia   . Dysarthria   . Hyperlipidemia   . Hypertension   . Hyponatremia   . Renal insufficiency   . Stroke [facility](HCC) 2007    Allergies No Known Allergies  IV Location/Drains/Wounds Patient Lines/Drains/Airways Status   Active Line/Drains/Airways    Name:   Placement date:   Placement time:   Site:   Days:   Peripheral IV 04/08/19 Left;Anterior Forearm   04/08/19    0100    Forearm   less than 1   External Urinary Catheter   04/07/19    2031    -   1          Labs/Imaging Results for orders placed or performed during the hospital encounter of 04/07/19 (from the past 48 hour(s))  SARS Coronavirus 2 [facility](Hospital order, [facility]) Nasopharyngeal Nasopharyngeal Swab     Status: None   Collection Time: 04/07/19  7:10 PM   Specimen: Nasopharyngeal Swab   Result Value Ref Range   SARS Coronavirus 2 NEGATIVE NEGATIVE    Comment: (NOTE) If result is NEGATIVE SARS-CoV-2 target nucleic acids are NOT DETECTED. The SARS-CoV-2 RNA is generally detectable in upper and lower  respiratory specimens during the acute phase of infection. The lowest  concentration of SARS-CoV-2 viral copies this assay can detect is 250  copies / mL. A negative result does not preclude SARS-CoV-2 infection  and should not be used as the sole basis for treatment or other  patient management decisions.  A negative result may occur with  improper specimen collection / handling, submission of specimen other  than nasopharyngeal swab, presence of viral mutation(s) within the  areas targeted by this assay, and inadequate number of viral copies  (<250 copies / mL). A negative result must be combined with clinical  observations, patient history, and epidemiological information. If result is POSITIVE SARS-CoV-2 target nucleic acids are DETECTED. The SARS-CoV-2 RNA is generally detectable in upper and lower  respiratory specimens dur ing the acute phase of infection.  Positive  results are indicative of active infection with SARS-CoV-2.  Clinical  correlation with patient history and other diagnostic information is  necessary to determine patient infection status.  Positive results do  not rule out bacterial infection  or co-infection with other viruses. If result is PRESUMPTIVE POSTIVE SARS-CoV-2 nucleic acids MAY BE PRESENT.   A presumptive positive result was obtained on the submitted specimen  and confirmed on repeat testing.  While 2019 novel coronavirus  (SARS-CoV-2) nucleic acids may be present in the submitted sample  additional confirmatory testing may be necessary for epidemiological  and / or clinical management purposes  to differentiate between  SARS-CoV-2 and other Sarbecovirus currently known to infect humans.  If clinically indicated additional testing with  an alternate test  methodology [phone]) is advised. The SARS-CoV-2 RNA is generally  detectable in upper and lower respiratory sp ecimens during the acute  phase of infection. The expected result is Negative. Fact Sheet for Patients:  BoilerBrush.com.cy Fact Sheet for Healthcare Providers: https://pope.com/ This test is not yet approved or cleared by the [place] FDA and has been authorized for detection and/or diagnosis of SARS-CoV-2 by FDA under an Emergency Use Authorization (EUA).  This EUA will remain in effect (meaning this test can be used) for the duration of the COVID-19 declaration under Section 564(b)(1) of the Act, 21 U.S.C. section 360bbb-3(b)(1), unless the authorization is terminated or revoked sooner. Performed at [facility], [address], [place], [place] [address]   Comprehensive metabolic panel     Status: Abnormal   Collection Time: 04/07/19 10:14 PM  Result Value Ref Range   Sodium 152 (H) 135 - 145 mmol/L   Potassium 4.1 3.5 - 5.1 mmol/L   Chloride 113 (H) 98 - 111 mmol/L   CO2 27 22 - 32 mmol/L   Glucose, Bld 100 (H) 70 - 99 mg/dL   BUN 53 (H) 8 - 23 mg/dL   Creatinine, Ser 7.42 0.61 - 1.24 mg/dL   Calcium 59.5 (H) 8.9 - 10.3 mg/dL   Total Protein 8.1 6.5 - 8.1 g/dL   Albumin 3.5 3.5 - 5.0 g/dL   AST 67 (H) 15 - 41 U/L   ALT 62 (H) 0 - 44 U/L   Alkaline Phosphatase 66 38 - 126 U/L   Total Bilirubin 0.9 0.3 - 1.2 mg/dL   GFR calc non Af Amer >60 >60 mL/min   GFR calc Af Amer >60 >60 mL/min   Anion gap 12 5 - 15    Comment: Performed at [facility], [address], [place], [place] [address]  CBC with Differential     Status: Abnormal   Collection Time: 04/07/19 10:14 PM  Result Value Ref Range   WBC 11.5 (H) 4.0 - 10.5 K/uL   RBC 4.53 4.22 - 5.81 MIL/uL   Hemoglobin 12.6 (L) 13.0 - 17.0 g/dL   HCT 64.3 32.9 - 51.8 %   MCV 91.4 80.0 - 100.0 fL   MCH 27.8  26.0 - 34.0 pg   MCHC 30.4 30.0 - 36.0 g/dL   RDW 84.1 66.0 - 63.0 %   Platelets 238 150 - 400 K/uL   nRBC 0.0 0.0 - 0.2 %   Neutrophils Relative % 81 %   Neutro Abs 9.3 (H) 1.7 - 7.7 K/uL   Lymphocytes Relative 8 %   Lymphs Abs 0.9 0.7 - 4.0 K/uL   Monocytes Relative 10 %   Monocytes Absolute 1.2 (H) 0.1 - 1.0 K/uL   Eosinophils Relative 0 %   Eosinophils Absolute 0.0 0.0 - 0.5 K/uL   Basophils Relative 0 %   Basophils Absolute 0.0 0.0 - 0.1 K/uL   Immature Granulocytes 1 %   Abs Immature  Granulocytes 0.08 (H) 0.00 - 0.07 K/uL    Comment: Performed at [facility], [place] [address] [place], [place] [address]  Urinalysis, Routine w reflex microscopic     Status: Abnormal   Collection Time: 04/08/19  2:27 AM  Result Value Ref Range   Color, Urine YELLOW YELLOW   APPearance CLOUDY (A) CLEAR   Specific Gravity, Urine 1.025 1.005 - 1.030   pH 6.0 5.0 - 8.0   Glucose, UA NEGATIVE NEGATIVE mg/dL   Hgb urine dipstick SMALL (A) NEGATIVE   Bilirubin Urine NEGATIVE NEGATIVE   Ketones, ur TRACE (A) NEGATIVE mg/dL   Protein, ur 30 (A) NEGATIVE mg/dL   Nitrite NEGATIVE NEGATIVE   Leukocytes,Ua SMALL (A) NEGATIVE    Comment: Performed at [facility] [address] [place], [place] [address]  Urinalysis, Microscopic (reflex)     Status: Abnormal   Collection Time: 04/08/19  2:27 AM  Result Value Ref Range   RBC / HPF 0-5 0 - 5 RBC/hpf   WBC, UA 6-10 0 - 5 WBC/hpf   Bacteria, UA MANY (A) NONE SEEN   Squamous Epithelial / LPF 6-10 0 - 5   Uric Acid Crys, UA PRESENT     Comment: Performed at [facility], [place] [address] [place], [place] [address]  CBC     Status: Abnormal   Collection Time: 04/08/19  5:00 AM  Result Value Ref Range   WBC 9.8 4.0 - 10.5 K/uL   RBC 4.12 (L) 4.22 - 5.81 MIL/uL   Hemoglobin 11.7 (L) 13.0 - 17.0 g/dL   HCT 38.2 (L) 39.0 - 52.0 %   MCV 92.7 80.0 - 100.0 fL   MCH 28.4 26.0 - 34.0 pg   MCHC 30.6  30.0 - 36.0 g/dL   RDW 13.9 11.5 - 15.5 %   Platelets 260 150 - 400 K/uL   nRBC 0.0 0.0 - 0.2 %    Comment: Performed at [facility], [place] [address], [place], [place] [address]  Renal function panel     Status: Abnormal   Collection Time: 04/08/19  5:00 AM  Result Value Ref Range   Sodium 144 135 - 145 mmol/L    Comment: DELTA CHECK NOTED REPEATED TO VERIFY    Potassium 3.9 3.5 - 5.1 mmol/L   Chloride 104 98 - 111 mmol/L   CO2 28 22 - 32 mmol/L   Glucose, Bld 160 (H) 70 - 99 mg/dL   BUN 48 (H) 8 - 23 mg/dL   Creatinine, Ser 1.07 0.61 - 1.24 mg/dL   Calcium 9.8 8.9 - 10.3 mg/dL   Phosphorus 3.4 2.5 - 4.6 mg/dL   Albumin 3.1 (L) 3.5 - 5.0 g/dL   GFR calc non Af Amer >60 >60 mL/min   GFR calc Af Amer >60 >60 mL/min   Anion gap 12 5 - 15    Comment: Performed at [facility], [place] [address] [place], [place] [address]   Ct Head Wo Contrast  Result Date: 04/07/2019 CLINICAL DATA:  Patient status post fall today.  Initial encounter. EXAM: CT HEAD WITHOUT CONTRAST TECHNIQUE: Contiguous axial images were obtained from the base of the skull through the vertex without intravenous contrast. COMPARISON:  Head CT scan 02/18/2019. FINDINGS: Brain: No evidence of acute infarction, hemorrhage, hydrocephalus, extra-axial collection or mass lesion/mass effect. Atrophy, chronic microvascular ischemic change, remote bilateral cerebellar infarcts and bilateral basal ganglia infarcts all again seen. Vascular: Extensive atherosclerosis.  No hyperdense  vessel. Skull: Intact.  No focal lesion. Sinuses/Orbits: Mucous retention cyst or polyp left maxillary sinus noted. Other: None. IMPRESSION: No acute intracranial abnormality. Atrophy, chronic microvascular ischemic change remote infarcts as described above. Extensive atherosclerosis. Electronically Signed   By: Drusilla Kannerhomas  Dalessio M.D.   On: 04/07/2019 21:17   Mr Brain Wo Contrast  Result Date: 04/08/2019 CLINICAL DATA:   Subacute neuro deficit.  Altered mental status. EXAM: MRI HEAD WITHOUT CONTRAST TECHNIQUE: Multiplanar, multiecho pulse sequences of the brain and surrounding structures were obtained without intravenous contrast. COMPARISON:  CT head 04/07/2019 FINDINGS: Brain: Moderate atrophy. Extensive chronic ischemic changes in the white matter and thalamus and basal ganglia bilaterally. Chronic lacunar infarction left pons with mild ischemic change in the right pons. Chronic hemorrhagic infarct right cerebellum. Mild chronic ischemia in the left cerebellum. Chronic microhemorrhage left parietal cortex and left posterior thalamus. Chronic hemorrhage in the right cerebellar infarct. Motion degraded study. Possible small area of acute infarct in the posterior external capsule on the left. This is only seen on axial diffusion and not confirmed on coronal diffusion. Vascular: Normal vascular flow voids Skull and upper cervical spine: Negative Sinuses/Orbits: Mucosal edema paranasal sinuses. Bilateral cataract surgery Other: None IMPRESSION: Motion degraded study. Possible small acute infarct posterior external capsule left Atrophy and advanced chronic ischemic changes as above. Electronically Signed   By: Marlan Palauharles  Clark M.D.   On: 04/08/2019 10:15   Dg Chest Portable 1 View  Result Date: 04/07/2019 CLINICAL DATA:  Altered mental status EXAM: PORTABLE CHEST 1 VIEW COMPARISON:  12/16/2007 FINDINGS: Hyperinflated lungs. No focal airspace disease or effusion. Normal cardiomediastinal silhouette. No pneumothorax. Stable right apically pleural and parenchymal scarring. IMPRESSION: No active disease.  Pulmonary hyperinflation Electronically Signed   By: Jasmine PangKim  Fujinaga M.D.   On: 04/07/2019 19:47    Pending Labs Unresulted Labs (From admission, onward)    Start     Ordered   04/09/19 0500  Hepatic function panel  Tomorrow morning,   R     04/08/19 1315   04/08/19 0902  Culture, Urine  Add-on,   AD     04/08/19 0901           Vitals/Pain Today's Vitals   04/08/19 1021 04/08/19 1100 04/08/19 1200 04/08/19 1300  BP:  (!) 149/92 135/84 131/90  Pulse: 96  92 (!) 105  Resp: 17 13 17 15   Temp:      TempSrc:      SpO2: 100%  95% 100%  Weight:      PainSc:   0-No pain     Isolation Precautions No active isolations  Medications Medications  dextrose 5 % solution ( Intravenous Rate/Dose Verify 04/08/19 1244)  enoxaparin (LOVENOX) injection 40 mg (has no administration in time range)  acetaminophen (TYLENOL) tablet 650 mg (650 mg Oral Given 04/08/19 0148)    Or  acetaminophen (TYLENOL) suppository 650 mg ( Rectal See Alternative 04/08/19 0148)  aspirin EC tablet 81 mg (81 mg Oral Not Given 04/08/19 0300)  sodium chloride 0.9 % bolus 500 mL (0 mLs Intravenous Stopped 04/07/19 2300)    Mobility non-ambulatory

## 2019-04-08 NOTE — ED Notes (Signed)
Pt transported to MRI 

## 2019-04-08 NOTE — Progress Notes (Signed)
CSW received call from Samule Ohm [phone]) with DSS regarding patient's needs. Raymond Knight reports patient is currently living with his wife who is not fully capable to care for him. Per [place] patient daughter is open to have patient come live with her if patient is agreeable, but a barrier has been patient's wife (daughter's step-mother) not wanting that to take place. CSW explained that patient is being admitted onto the medical floor and will leave a handoff for the CSW of that floor to contact her on patient's progress/discharge needs.  [place], LCSW Transitions of Care Department [facility] ED [phone]

## 2019-04-08 NOTE — ED Notes (Signed)
Report called to [facility]- they are en route.

## 2019-04-08 NOTE — ED Notes (Signed)
MD Grandville Silos at bedside, updating patient's daughter on POC.

## 2019-04-08 NOTE — ED Notes (Addendum)
Upon entering pt room, pt IV was removed from his left arm. Moved pt to bed with weight capabilities with assist from charge and tech.

## 2019-04-08 NOTE — ED Notes (Signed)
Messaged provider to ask if MRI is stat or can wait until [facility] MRI opens in the morning. Provider sd MRI can wait until the morning.

## 2019-04-08 NOTE — H&P (Signed)
History and Physical    Raymond Knight ESP:[id number] DOB: 06-Mar-1946 DOA: 04/07/2019  PCP: Patient, No Pcp Per  Patient coming from: Home via EMS  I have personally briefly reviewed patient's old medical records in [place]  Chief Complaint: Altered mental status  HPI: Raymond Knight is a 73 y.o. 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  History limited from patient and is otherwise supplemented from EDP and chart review.  Patient reportedly brought into the ED via EMS after daughter called 911 due to patient change in mental status.  Patient has chronic speech deficit but reportedly had notable increased difficulty speaking.  He ambulates with the use of a walker but reportedly has not had any recent falls.  Patient has not had any recent chest pain, dyspnea, abdominal pain, diarrhea, or dysuria.  He denies nausea, vomiting.  He is not taking any medications.  ED Course:  Initial vitals showed BP 115/71, pulse 117, RR 22, temp 97.7 Fahrenheit, SPO2 100% on room air.  Labs notable for sodium 152, potassium 4.1, bicarb 27, BUN 53, creatinine 1.17, calcium 10.5, albumin 3.5, AST 67, ALT 62, alk phos 66, T bili 0.9, WBC 11.5, hemoglobin 12.6, platelets 238,000.  Portable chest x-ray showed hyperinflated lungs without focal consolidation or effusion.  CT head without contrast was negative for acute intracranial abnormality.  Atrophy and chronic microvascular ischemic change remote infarcts of bilateral cerebellum and basal ganglia are seen.  Patient was given 500 mLs normal saline and ordered to receive IV D5.  The hospitalist service was consulted to admit for further evaluation and management.  Review of Systems:  Unable to obtain full review of systems due to altered mental status.   Past Medical History:  Diagnosis Date  . Stroke [facility] 2007     No past surgical history on file.  Social History:  reports that he has been smoking. He has never used smokeless tobacco. He reports that he does not drink alcohol or use drugs.  No Known Allergies  No family history on file.   Prior to Admission medications   Not on File    Physical Exam: Vitals:   04/08/19 0016 04/08/19 0017 04/08/19 0022 04/08/19 0100  BP:   119/76 (!) 154/112  Pulse:    (!) 120  Resp:      Temp:      TempSrc:      SpO2:    99%  Weight: 49.9 kg 49.7 kg     Limited due to AMS. Constitutional: Chronically ill-appearing thin man resting in bed in the left lateral decubitus position, NAD, calm, comfortable Eyes: PERRL, EOMI, lids and conjunctivae normal.  Arcus senilis. ENMT: Mucous membranes are dry. Posterior pharynx clear of any exudate or lesions. Neck: normal, supple, no masses. Respiratory: clear to auscultation bilaterally, no wheezing, no crackles. Normal respiratory effort. No accessory muscle use.  Cardiovascular: Regular rate and rhythm, no murmurs / rubs / gallops. No extremity edema. Abdomen: no tenderness, no masses palpated. No hepatosplenomegaly. Bowel sounds positive.  Musculoskeletal: Thin extremities, no clubbing / cyanosis. No joint deformity upper and lower extremities. Good ROM, no contractures. Normal muscle tone.  Skin: Multiple abrasions on lower legs Neurologic: Following simple commands, strength intact bilateral upper extremities.  Moving lower extremities spontaneously and to command but unable to elevate off bed, suspect partially due to his positioning.  Sensation appears intact.  Nods head yes/no and occasionally answers with one-word sentences with very soft voice Psychiatric: Awake and following simple commands.  Unable to verbalize to answer orientation questions.    Labs on Admission: I have personally reviewed following labs and imaging studies  CBC: Recent Labs  Lab 04/07/19 2214  WBC 11.5*  NEUTROABS 9.3*  HGB  12.6*  HCT 41.4  MCV 91.4  PLT 270   Basic Metabolic Panel: Recent Labs  Lab 04/07/19 2214  NA 152*  K 4.1  CL 113*  CO2 27  GLUCOSE 100*  BUN 53*  CREATININE 1.17  CALCIUM 10.5*   GFR: CrCl cannot be calculated (Unknown ideal weight.). Liver Function Tests: Recent Labs  Lab 04/07/19 2214  AST 67*  ALT 62*  ALKPHOS 66  BILITOT 0.9  PROT 8.1  ALBUMIN 3.5   No results for input(s): LIPASE, AMYLASE in the last 168 hours. No results for input(s): AMMONIA in the last 168 hours. Coagulation Profile: No results for input(s): INR, PROTIME in the last 168 hours. Cardiac Enzymes: No results for input(s): CKTOTAL, CKMB, CKMBINDEX, TROPONINI in the last 168 hours. BNP (last 3 results) No results for input(s): PROBNP in the last 8760 hours. HbA1C: No results for input(s): HGBA1C in the last 72 hours. CBG: No results for input(s): GLUCAP in the last 168 hours. Lipid Profile: No results for input(s): CHOL, HDL, LDLCALC, TRIG, CHOLHDL, LDLDIRECT in the last 72 hours. Thyroid Function Tests: No results for input(s): TSH, T4TOTAL, FREET4, T3FREE, THYROIDAB in the last 72 hours. Anemia Panel: No results for input(s): VITAMINB12, FOLATE, FERRITIN, TIBC, IRON, RETICCTPCT in the last 72 hours. Urine analysis:    Component Value Date/Time   COLORURINE COLORLESS (A) 02/18/2019 1937   APPEARANCEUR CLEAR 02/18/2019 1937   LABSPEC 1.003 (L) 02/18/2019 1937   PHURINE 6.0 02/18/2019 1937   GLUCOSEU NEGATIVE 02/18/2019 1937   HGBUR SMALL (A) 02/18/2019 1937   BILIRUBINUR NEGATIVE 02/18/2019 1937   KETONESUR NEGATIVE 02/18/2019 1937   PROTEINUR NEGATIVE 02/18/2019 1937   NITRITE NEGATIVE 02/18/2019 1937   LEUKOCYTESUR NEGATIVE 02/18/2019 1937    Radiological Exams on Admission: Ct Head Wo Contrast  Result Date: 04/07/2019 CLINICAL DATA:  Patient status post fall today.  Initial encounter. EXAM: CT HEAD WITHOUT CONTRAST TECHNIQUE: Contiguous axial images were obtained from the base  of the skull through the vertex without intravenous contrast. COMPARISON:  Head CT scan 02/18/2019. FINDINGS: Brain: No evidence of acute infarction, hemorrhage, hydrocephalus, extra-axial collection or mass lesion/mass effect. Atrophy, chronic microvascular ischemic change, remote bilateral cerebellar infarcts and bilateral basal ganglia infarcts all again seen. Vascular: Extensive atherosclerosis.  No hyperdense vessel. Skull: Intact.  No focal lesion. Sinuses/Orbits: Mucous retention cyst or polyp left maxillary sinus noted. Other: None. IMPRESSION: No acute intracranial abnormality. Atrophy, chronic microvascular ischemic change remote infarcts as described above. Extensive atherosclerosis. Electronically Signed   By: Inge Rise M.D.   On: 04/07/2019 21:17   Dg Chest Portable 1 View  Result Date: 04/07/2019 CLINICAL DATA:  Altered mental status EXAM: PORTABLE CHEST 1 VIEW COMPARISON:  12/16/2007 FINDINGS: Hyperinflated lungs. No focal airspace disease or effusion. Normal cardiomediastinal silhouette. No pneumothorax. Stable right apically pleural and parenchymal scarring. IMPRESSION: No active disease.  Pulmonary hyperinflation Electronically Signed   By: Donavan Foil M.D.   On: 04/07/2019 19:47    EKG: Independently reviewed. Sinus tachycardia, PVCs.  Assessment/Plan Active Problems:   Hypernatremia   Altered mental status   History of CVA (cerebrovascular accident)   Hypercalcemia  Raymond Knight  Raymond Knight is a 73 y.o. 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  Has history of stroke in the past, not currently on medication for secondary prevention.  No obvious infectious source although urinalysis is pending.  CT head is negative for acute intracranial abnormality. -Will obtain MRI brain to rule out acute CVA -Continue neurochecks and allow permissive  hypertension pending MRI -Follow-up urinalysis -Correct electrolyte abnormalities -Request PT/OT/SLP eval  Hypernatremia: With 2.1 L free water deficit.  Will continue IV D5 at 75 mL/hour overnight and repeat labs in a.m.  Hypercalcemia: Mild hypercalcemia, likely from volume depletion.  Continue fluids as above and recheck in a.m.  History of CVA: In 2007 with reported residual right-sided weakness and speech deficit.  Not currently taking any medications.  Will start aspirin 81 mg daily.   DVT prophylaxis: Lovenox Code Status: Full code Family Communication: None present on admission Disposition Plan: Pending clinical progress Consults called: None Admission status: Observation   Zada Finders MD [facility]  If 7PM-7AM, please contact night-coverage [email or web address]  04/08/2019, 1:29 AM

## 2019-04-08 NOTE — ED Notes (Signed)
Patient breakfast tray not provided due to this RN's concern for patient aspiration during MRI. Patient reports he "isnt hungry anyway." Will complete swallow screen when patient returns.

## 2019-04-08 NOTE — ED Notes (Signed)
Tech and I removed pt's adult brief, cleaned him, and applied condom cath.

## 2019-04-08 NOTE — ED Notes (Signed)
Pt removed O2 sensor. Unsuccessfully tried to remove armband.

## 2019-04-08 NOTE — ED Notes (Signed)
Patient daughter at bedside. States patient usually eats and drinks by himself at home but she "worries he might not swallow too well." Speech therapist ordered.

## 2019-04-08 NOTE — ED Notes (Signed)
[facility] present to transport patient to [facility] 3W bed 21.

## 2019-04-08 NOTE — ED Notes (Addendum)
Pt. Documented in error MR BRAIN WO CONTRAST. 

## 2019-04-08 NOTE — Progress Notes (Signed)
I have seen and assessed patient and agree with Dr. Serita Grit assessment and plan.  Patient is a pleasant 73 year old gentleman medical history significant for stroke with residual right-sided weakness and some speech deficits who presented to the ED for evaluation of altered mental status.  Lab work done on admission showed patient to be hyponatremic, clinically dehydrated.  CT head which was done was negative for any acute intracranial abnormality.  Patient was placed on IV fluids.  Urinalysis ordered.  Chest x-ray done showed pulmonary hyperinflation.  Patient noted to have a slight left facial droop with right lower extremity weakness and dysarthric speech which per daughter was new.  Per daughter patient also noted to have falling approximately a month ago and was assessed in the ED and subsequently discharged home.  Per daughter patient has not been doing well since then.  MRI of the head obtained with possible small acute infarct in the posterior external capsule on the left.  Also noted on MRI was chronic lacunar infarcts in the left pons and mild ischemic change in the right pons, chronic hemorrhagic infarct in the right cerebellum, mild chronic ischemia in the left cerebellum, chronic microhemorrhage left parietal cortex and left posterior thalamus, chronic hemorrhage in the right cerebellar infarct.  Consulted with neurology who recommended transfer to [facility] for stroke work-up.  Stroke order set ordered.  Patient maintained on aspirin.  Will discontinue Lovenox due to MRI findings of chronic hemorrhagic infarcts.  Follow.  No charge.

## 2019-04-08 NOTE — ED Notes (Signed)
Attempted to call report to [place]- was told the RN to take report was in a code and to call back. [facility] called for patient transport to [facility]- patient is on the list. [facility] reports increased delays in transport. Patient and daughter updated on delay.

## 2019-04-09 ENCOUNTER — Inpatient Hospital Stay (HOSPITAL_COMMUNITY): Payer: Medicare Other

## 2019-04-09 DIAGNOSIS — I639 Cerebral infarction, unspecified: Secondary | ICD-10-CM

## 2019-04-09 DIAGNOSIS — I361 Nonrheumatic tricuspid (valve) insufficiency: Secondary | ICD-10-CM

## 2019-04-09 DIAGNOSIS — R4182 Altered mental status, unspecified: Secondary | ICD-10-CM

## 2019-04-09 LAB — CBC
HCT: 38.7 % — ABNORMAL LOW (ref 39.0–52.0)
Hemoglobin: 12.2 g/dL — ABNORMAL LOW (ref 13.0–17.0)
MCH: 28.2 pg (ref 26.0–34.0)
MCHC: 31.5 g/dL (ref 30.0–36.0)
MCV: 89.4 fL (ref 80.0–100.0)
Platelets: 298 10*3/uL (ref 150–400)
RBC: 4.33 MIL/uL (ref 4.22–5.81)
RDW: 13.5 % (ref 11.5–15.5)
WBC: 9.1 10*3/uL (ref 4.0–10.5)
nRBC: 0 % (ref 0.0–0.2)

## 2019-04-09 LAB — HEPATIC FUNCTION PANEL
ALT: 50 U/L — ABNORMAL HIGH (ref 0–44)
AST: 50 U/L — ABNORMAL HIGH (ref 15–41)
Albumin: 2.8 g/dL — ABNORMAL LOW (ref 3.5–5.0)
Alkaline Phosphatase: 59 U/L (ref 38–126)
Bilirubin, Direct: 0.2 mg/dL (ref 0.0–0.2)
Indirect Bilirubin: 0.3 mg/dL (ref 0.3–0.9)
Total Bilirubin: 0.5 mg/dL (ref 0.3–1.2)
Total Protein: 7.3 g/dL (ref 6.5–8.1)

## 2019-04-09 LAB — BASIC METABOLIC PANEL
Anion gap: 13 (ref 5–15)
BUN: 30 mg/dL — ABNORMAL HIGH (ref 8–23)
CO2: 27 mmol/L (ref 22–32)
Calcium: 9.7 mg/dL (ref 8.9–10.3)
Chloride: 104 mmol/L (ref 98–111)
Creatinine, Ser: 1.02 mg/dL (ref 0.61–1.24)
GFR calc Af Amer: >60 mL/min (ref >60)
GFR calc non Af Amer: >60 mL/min (ref >60)
Glucose, Bld: 102 mg/dL — ABNORMAL HIGH (ref 70–99)
Potassium: 3.6 mmol/L (ref 3.5–5.1)
Sodium: 144 mmol/L (ref 135–145)

## 2019-04-09 LAB — URINE CULTURE: Culture: 30000 — AB

## 2019-04-09 LAB — LIPID PANEL
Cholesterol: 124 mg/dL (ref 0–200)
HDL: 36 mg/dL — ABNORMAL LOW (ref >40)
LDL Cholesterol: 59 mg/dL (ref 0–99)
Total CHOL/HDL Ratio: 3.4 RATIO
Triglycerides: 145 mg/dL (ref <150)
VLDL: 29 mg/dL (ref 0–40)

## 2019-04-09 LAB — HEMOGLOBIN A1C
Hgb A1c MFr Bld: 5.7 % — ABNORMAL HIGH (ref 4.8–5.6)
Mean Plasma Glucose: 116.89 mg/dL

## 2019-04-09 LAB — ECHOCARDIOGRAM COMPLETE: Weight: 1753 oz

## 2019-04-09 MED ORDER — METOPROLOL TARTRATE 5 MG/5ML IV SOLN
5.0000 mg | Freq: Once | INTRAVENOUS | Status: AC
  Administered 2019-04-09: 05:00:00 5 mg via INTRAVENOUS
  Filled 2019-04-09: qty 5

## 2019-04-09 NOTE — Progress Notes (Addendum)
Carotid artery duplex and TCD completed. Refer to "CV Proc" under chart review to view preliminary results.  04/09/2019 10:12 AM Maudry Mayhew, MHA, RVT, RDCS, RDMS

## 2019-04-09 NOTE — Evaluation (Signed)
Speech Language Pathology Evaluation Patient Details Name: Raymond Knight MRN: [id number] DOB: 06-Nov-1945 Today's Date: 04/09/2019 Time: 1232-1250 SLP Time Calculation (min) (ACUTE ONLY): 18 min  Problem List:  Patient Active Problem List   Diagnosis Date Noted  . Hypernatremia 04/08/2019  . Altered mental status 04/08/2019  . History of CVA (cerebrovascular accident) 04/08/2019  . Hypercalcemia 04/08/2019  . CVA (cerebral vascular accident) ([place]) 04/08/2019   Past Medical History:  Past Medical History:  Diagnosis Date  . Anemia   . Dysarthria   . Hyperlipidemia   . Hypertension   . Hyponatremia   . Renal insufficiency   . Stroke [facility]) 2007   Past Surgical History:  Past Surgical History:  Procedure Laterality Date  . colonoscopy with biopsy  12/18/2007  . ESOPHAGOGASTRODUODENOSCOPY ENDOSCOPY  12/18/2007  . ORIF Left Hip fracture  01/23/2007   HPI:  Pt is a 73 yo male s/p MRI revealing possible L posterior external capsule. PMHx: HTN, HLD, CVA R residual weakness, ORIF L hip.   Assessment / Plan / Recommendation Clinical Impression   Pt presents with moderately severe cognitive deficits.  Pt was oriented to place and self only but could reorient to year when provided with a choice of three.  Pt demonstrated decreased sustained attention to tasks, decreased functional problem solving, decreased intellectual awareness of deficits, and decreased recall of daily information.  I suspect that at least some of these deficits were present at baseline; however, no family was present to confirm previous level of function.  Furthermore, pt presents with hoarse, breathy vocal quality and imprecise articulation of consonants from generalized oral motor weakness which impact his intelligibility at the phrase level.  As a result, pt would benefit from skilled ST while inpatient in order to maximize functional independence and reduce burden of care prior to discharge.  Anticipate that pt will  need 24/7 supervision at discharge and possible ST follow up at next level of care.      SLP Assessment  SLP Recommendation/Assessment: Patient needs continued Speech Lanaguage Pathology Services SLP Visit Diagnosis: Dysphagia, oropharyngeal phase (R13.12);Dysarthria and anarthria (R47.1);Aphonia (R49.1);Cognitive communication deficit (R41.841)    Follow Up Recommendations  24 hour supervision/assistance;Skilled Nursing facility    Frequency and Duration min 2x/week         SLP Evaluation Cognition  Overall Cognitive Status: No family/caregiver present to determine baseline cognitive functioning Arousal/Alertness: Awake/alert Orientation Level: Oriented to person;Oriented to place;Disoriented to situation;Disoriented to time Attention: Sustained Sustained Attention: Impaired Sustained Attention Impairment: Functional basic;Verbal basic Memory: Impaired Memory Impairment: Decreased recall of new information Awareness: Impaired Awareness Impairment: Intellectual impairment Problem Solving: Impaired Problem Solving Impairment: Functional basic Behaviors: Restless Safety/Judgment: Impaired       Comprehension  Auditory Comprehension Overall Auditory Comprehension: Appears within functional limits for tasks assessed    Expression Expression Primary Mode of Expression: Verbal Verbal Expression Overall Verbal Expression: Appears within functional limits for tasks assessed Written Expression Dominant Hand: Left   Oral / Motor  Oral Motor/Sensory Function Overall Oral Motor/Sensory Function: Generalized oral weakness Motor Speech Overall Motor Speech: Impaired Respiration: Impaired Level of Impairment: Phrase Phonation: Breathy;Hoarse Resonance: Within functional limits Articulation: Impaired Level of Impairment: Phrase Intelligibility: Intelligibility reduced Phrase: 50-74% accurate Motor Planning: Witnin functional limits   GO                    Raymond Knight, Raymond Knight 04/09/2019, 1:59 PM

## 2019-04-09 NOTE — Progress Notes (Signed)
STROKE TEAM PROGRESS NOTE   Raymond Knight is a 73 y.o. male with past medical history significant for hypertension, hyperlipidemia, prior stroke with residual right hemiparesis (although some charts mention this as left)  presents to the emergency department for altered mental status and worsening speech, found to have a possible lacunar infarct, admitted for stroke work up.  INTERVAL HISTORY MRI brain significant for possible small acute infarct in posterior L external capsule, echo with reduced EF 40-45%, stroke work up ongoing. On assessment, pt is cachectic, has lower extremity contracture, and ataxic on LUE + L hand weakness.   Vitals:   04/09/19 0200 04/09/19 0334 04/09/19 1039 04/09/19 1100  BP: 132/84 (!) 148/71  129/77  Pulse: (!) 119 (!) 120 93   Resp: 15 (!) 24    Temp:  98.1 F (36.7 C) (!) 97.5 F (36.4 C)   TempSrc:  Oral Oral   SpO2: 100% 93% 100%   Weight:        CBC:  Recent Labs  Lab 04/07/19 2214 04/08/19 0500 04/09/19 0316  WBC 11.5* 9.8 9.1  NEUTROABS 9.3*  --   --   HGB 12.6* 11.7* 12.2*  HCT 41.4 38.2* 38.7*  MCV 91.4 92.7 89.4  PLT 238 260 298    Basic Metabolic Panel:  Recent Labs  Lab 04/08/19 0500 04/09/19 0316  NA 144 144  K 3.9 3.6  CL 104 104  CO2 28 27  GLUCOSE 160* 102*  BUN 48* 30*  CREATININE 1.07 1.02  CALCIUM 9.8 9.7  PHOS 3.4  --    Lipid Panel:     Component Value Date/Time   CHOL 124 04/09/2019 0316   TRIG 145 04/09/2019 0316   HDL 36 (L) 04/09/2019 0316   CHOLHDL 3.4 04/09/2019 0316   VLDL 29 04/09/2019 0316   LDLCALC 59 04/09/2019 0316   HgbA1c:  Lab Results  Component Value Date   HGBA1C 5.7 (H) 04/09/2019   Urine Drug Screen: No results found for: LABOPIA, COCAINSCRNUR, LABBENZ, AMPHETMU, THCU, LABBARB  Alcohol Level No results found for: ETH  IMAGING Ct Head Wo Contrast  Result Date: 04/07/2019 CLINICAL DATA:  Patient status post fall today.  Initial encounter. EXAM: CT HEAD WITHOUT CONTRAST TECHNIQUE:  Contiguous axial images were obtained from the base of the skull through the vertex without intravenous contrast. COMPARISON:  Head CT scan 02/18/2019. FINDINGS: Brain: No evidence of acute infarction, hemorrhage, hydrocephalus, extra-axial collection or mass lesion/mass effect. Atrophy, chronic microvascular ischemic change, remote bilateral cerebellar infarcts and bilateral basal ganglia infarcts all again seen. Vascular: Extensive atherosclerosis.  No hyperdense vessel. Skull: Intact.  No focal lesion. Sinuses/Orbits: Mucous retention cyst or polyp left maxillary sinus noted. Other: None. IMPRESSION: No acute intracranial abnormality. Atrophy, chronic microvascular ischemic change remote infarcts as described above. Extensive atherosclerosis. Electronically Signed   By: Drusilla Kannerhomas  Dalessio M.D.   On: 04/07/2019 21:17   Mr Brain Wo Contrast  Result Date: 04/08/2019 CLINICAL DATA:  Subacute neuro deficit.  Altered mental status. EXAM: MRI HEAD WITHOUT CONTRAST TECHNIQUE: Multiplanar, multiecho pulse sequences of the brain and surrounding structures were obtained without intravenous contrast. COMPARISON:  CT head 04/07/2019 FINDINGS: Brain: Moderate atrophy. Extensive chronic ischemic changes in the white matter and thalamus and basal ganglia bilaterally. Chronic lacunar infarction left pons with mild ischemic change in the right pons. Chronic hemorrhagic infarct right cerebellum. Mild chronic ischemia in the left cerebellum. Chronic microhemorrhage left parietal cortex and left posterior thalamus. Chronic hemorrhage in the right cerebellar  infarct. Motion degraded study. Possible small area of acute infarct in the posterior external capsule on the left. This is only seen on axial diffusion and not confirmed on coronal diffusion. Vascular: Normal vascular flow voids Skull and upper cervical spine: Negative Sinuses/Orbits: Mucosal edema paranasal sinuses. Bilateral cataract surgery Other: None IMPRESSION: Motion  degraded study. Possible small acute infarct posterior external capsule left Atrophy and advanced chronic ischemic changes as above. Electronically Signed   By: Marlan Palauharles  Clark M.D.   On: 04/08/2019 10:15   Dg Chest Portable 1 View  Result Date: 04/07/2019 CLINICAL DATA:  Altered mental status EXAM: PORTABLE CHEST 1 VIEW COMPARISON:  12/16/2007 FINDINGS: Hyperinflated lungs. No focal airspace disease or effusion. Normal cardiomediastinal silhouette. No pneumothorax. Stable right apically pleural and parenchymal scarring. IMPRESSION: No active disease.  Pulmonary hyperinflation Electronically Signed   By: Jasmine PangKim  Fujinaga M.D.   On: 04/07/2019 19:47   [facility] [place] [facility]id [facility]  Result Date: 04/09/2019 Carotid Arterial Duplex Study Indications:       CVA. Risk Factors:      Hypertension, hyperlipidemia. Comparison Study:  No prior studies. Performing Technologist: Gertie FeyMichelle Simonetti MHA, RDMS, RVT, RDCS  Examination Guidelines: A complete evaluation includes B-mode imaging, spectral Doppler, color Doppler, and power Doppler as needed of all accessible portions of each vessel. Bilateral testing is considered an integral part of a complete examination. Limited examinations for reoccurring indications may be performed as noted.  Right Carotid Findings: +----------+--------+--------+--------+---------------------+------------------+           PSV cm/sEDV cm/sStenosisPlaque Description   Comments           +----------+--------+--------+--------+---------------------+------------------+ CCA Prox  79      20                                   intimal thickening +----------+--------+--------+--------+---------------------+------------------+ CCA Distal90      20              homogeneous and                                                           smooth                                  +----------+--------+--------+--------+---------------------+------------------+ ICA Prox  42       15              smooth, heterogenous                                                      and calcific                            +----------+--------+--------+--------+---------------------+------------------+ ICA Distal65      21              smooth and  homogeneous                             +----------+--------+--------+--------+---------------------+------------------+ ECA       86      9               heterogenous, focal                                                       and irregular                           +----------+--------+--------+--------+---------------------+------------------+ +----------+--------+-------+----------------+-------------------+           PSV cm/sEDV cmsDescribe        Arm Pressure (mmHG) +----------+--------+-------+----------------+-------------------+ [id number]             Multiphasic, WNL                    +----------+--------+-------+----------------+-------------------+ +---------+--------+--+--------+-+---------+ VertebralPSV cm/s25EDV cm/s6Antegrade +---------+--------+--+--------+-+---------+  Left Carotid Findings: +----------+--------+--------+--------+---------------------+------------------+           PSV cm/sEDV cm/sStenosisPlaque Description   Comments           +----------+--------+--------+--------+---------------------+------------------+ CCA Prox  104     19                                   intimal thickening +----------+--------+--------+--------+---------------------+------------------+ CCA Distal49      11              smooth and                                                                heterogenous                            +----------+--------+--------+--------+---------------------+------------------+ ICA Prox  35      9               irregular,                                                                 heterogenous and                                                          calcific                                +----------+--------+--------+--------+---------------------+------------------+ ICA Distal42      11                                                      +----------+--------+--------+--------+---------------------+------------------+  ECA       45                      smooth and                                                                heterogenous                            +----------+--------+--------+--------+---------------------+------------------+ +----------+--------+--------+----------------+-------------------+           PSV cm/sEDV cm/sDescribe        Arm Pressure (mmHG) +----------+--------+--------+----------------+-------------------+ [id number]              Multiphasic, WNL                    +----------+--------+--------+----------------+-------------------+ +---------+--------+--+--------+--+---------+ VertebralPSV cm/s38EDV cm/s11Antegrade +---------+--------+--+--------+--+---------+  Summary: Right Carotid: Velocities in the right ICA are consistent with a 1-39% stenosis. Left Carotid: Velocities in the left ICA are consistent with a 1-39% stenosis. Vertebrals:  Bilateral vertebral arteries demonstrate antegrade flow. Subclavians: Normal flow hemodynamics were seen in bilateral subclavian              arteries. *See table(s) above for measurements and observations.  Electronically signed by Delia Heady MD on 04/09/2019 at 2:22:22 PM.    Final    Vas [place] Transcranial Doppler  Result Date: 04/09/2019  Transcranial Doppler Indications: Stroke. History: Hypertension, hyperlipidemia. Limitations: Poor acoustic windows Limitations for diagnostic windows: Unable to insonate left transtemporal window. Unable to insonate occipital window. Comparison Study: No prior study. Performing Technologist: Gertie Fey  MHA, RDMS, RVT, RDCS  Examination Guidelines: A complete evaluation includes B-mode imaging, spectral Doppler, color Doppler, and power Doppler as needed of all accessible portions of each vessel. Bilateral testing is considered an integral part of a complete examination. Limited examinations for reoccurring indications may be performed as noted.  +---------+-------------+----------+-----------+-------+ RIGHT TCDRight VM (cm)Depth (cm)PulsatilityComment +---------+-------------+----------+-----------+-------+ MCA          51.00                 1.16            +---------+-------------+----------+-----------+-------+ ACA         -45.00                 0.91            +---------+-------------+----------+-----------+-------+ Term ICA     38.00                 1.17            +---------+-------------+----------+-----------+-------+ PCA          19.00                 0.85            +---------+-------------+----------+-----------+-------+ Opthalmic    14.00                 1.38            +---------+-------------+----------+-----------+-------+  +---------+------------+----------+-----------+-------+ LEFT TCD Left VM (cm)Depth (cm)PulsatilityComment +---------+------------+----------+-----------+-------+ Opthalmic   15.00                 1.55            +---------+------------+----------+-----------+-------+  Summary:  Absent left temporal and occipital windows greatly limits study. Normal mean flow velocities in right middle,anterior ,posterior cerebrals, and bilateral opthalmic arteries. *See table(s) above for measurements and observations.  Diagnosing physician: Antony Contras MD Electronically signed by Antony Contras MD on 04/09/2019 at 2:24:02 PM.    Final    PHYSICAL EXAM GEN: Frail malnourished cachectic appearing elderly male CVS: RRR, no carotid bruit CHEST: No signs of resp distress, on room air ABD: Soft, NTTP  NEURO:   MENTAL STATUS: Awake alert follows  simple commands.  Diminished attention, registration and recall. LANG/SPEECH: dysarthric speech CRANIAL NERVES:  II: Pupils equal and reactive, no RAPD, decreased blink to threat on the left compared to the right. III, IV, VI: EOM intact, saccadic dysmetria on horizontal gaze.  Left eye exotropia.   V: normal  VII: Mild left lower facial weakness VIII: normal hearing to speech  MOTOR: L hand and grip mild weakness, BLE hip and knee flexion and lateral rotation contractures, unable to assess strength REFLEXES: 2/4 throughout, bilateral flexor planters  SENSORY: Normal to touch, temperature & pin prick in all extremiteis  COORD:L finger-to-nose ataxia   ASSESSMENT/PLAN Mr. Raymond Knight is a 73 y.o. male with history of hypertension, hyperlipidemia, prior stroke with residual weakness (unclear right vs left) who is admitted for acute L external capsule lacunar infarct.  Acute L lacunar infarct of external capsule, likely secondary to small vessel disease  MRI  Possible small acute infarct posterior external capsule left  Transcranial Doppler shows absent left temporal and occipital windows which greatly limited exam.  Normal mean flow velocities in the anterior circulation on the right.  Carotid Doppler no significant extracranial stenosis bilaterally.  2D Echo  The left ventricle has mildly reduced systolic function, with an ejection fraction of 45-50%. The cavity size was normal. There is mildly increased left ventricular wall thickness. Left ventricular diastolic Doppler parameters are consistent with impaired relaxation. Left ventricular diffuse hypokinesis.  LDL 59  HgbA1c 5.7  SCDs for VTE prophylaxis  Start asa 81 mg + Plavix 75 mg QD for 3 weeks, continue asa 81 mg after  Therapy recommendations:  SNF  Disposition: likely SNF . Permissive hypertension (OK if < 220/120) but gradually normalize in 5-7 days, Long-term BP goal normotensive  Other Stroke Risk  Factors  Advanced age  Hx stroke/TIA  Hospital day # 1 I have personally obtained history,examined this patient, reviewed notes, independently viewed imaging studies, participated in medical decision making and plan of care.ROS completed by me personally and pertinent positives fully documented  I have made any additions or clarifications directly to the above note.   He presented with altered mental status and worsening speech in the setting of multiple medical metabolic abnormalities and MRI shows medical metabolic parameters.  Small left external capsule lacunar infarct likely from small vessel disease.  Continue ongoing treatment and stroke work-up.  Recommend aspirin and Plavix for 3 weeks followed by aspirin alone.  Correct reversible medical metabolic parameters.  Discussed with Dr. Jake Church.  Greater than 50% time during this 25-minute visit was spent on counseling and coordination of care about lacunar stroke and answering questions.  Stroke team will sign off.  Kindly call for questions.   Antony Contras, MD Medical Director [place] Pager: [phone] 04/09/2019 4:50 PM  To contact Stroke Continuity provider, please refer to [email or web address] After hours, contact General Neurology

## 2019-04-09 NOTE — Consult Note (Addendum)
Requesting Physician: Dr. Delila Pereyra    Chief Complaint: Increase slurring of speech  History obtained from:  Chart    HPI:                                                                                                                                       Raymond Knight is a 73 y.o. male with past medical history significant for hypertension, hyperlipidemia, prior stroke with residual right hemiparesis (although some charts mention this as left)  presents to the emergency department for altered mental status and worsening speech. Patient is a poor historian and cannot give a history.  Per chart review, patient's daughter went to visit him and noticed that he was not himself.  He was speaking minimally and speech was more slurred.  Patient was admitted to medicine service for treatment of multiple metabolic abnormalities including hypernatremia. MRI brain was performed which showed possible infarct in the left posterior external capsule.  Neurology was consulted for further recommendations   Date last known well: 04/07/2019 tPA Given: No, outside TPA window Baseline MRS 2    Past Medical History:  Diagnosis Date  . Anemia   . Dysarthria   . Hyperlipidemia   . Hypertension   . Hyponatremia   . Renal insufficiency   . Stroke [facility]) 2007    Past Surgical History:  Procedure Laterality Date  . colonoscopy with biopsy  12/18/2007  . ESOPHAGOGASTRODUODENOSCOPY ENDOSCOPY  12/18/2007  . ORIF Left Hip fracture  01/23/2007    History reviewed. No pertinent family history. Social History:  reports that he has never smoked. He has never used smokeless tobacco. He reports that he does not drink alcohol or use drugs.  Allergies: No Known Allergies  Medications:                                                                                                                        I reviewed home medications   ROS:  14 systems reviewed and negative except above   Examination:                                                                                                      General: Appears well-developed Psych: Affect appropriate to situation Eyes: No scleral injection HENT: No OP obstrucion Head: Normocephalic.  Cardiovascular: Normal rate and regular rhythm.  Respiratory: Effort normal and breath sounds normal to anterior ascultation GI: Soft.  No distension. There is no tenderness.  Skin: WDI    Neurological Examination Mental Status: Alert, oriented, thought content appropriate.  No evidence of aphasia. Able to follow commands without difficulty.  Significant dysarthria Cranial Nerves: II: Visual fields grossly normal,  III,IV, VI: ptosis not present, extra-ocular motions intact bilaterally, pupils equal, round, reactive to light and accommodation V,VII: Mild right-sided facial droop VIII: hearing normal bilaterally IX,X: uvula rises symmetrically XI: bilateral shoulder shrug XII: midline tongue extension Motor: Right : Upper extremity   4+/5    Left:     Upper extremity   4+/5  Lower extremity   3/5     Lower extremity   4/5 Tone and bulk:normal tone throughout; no atrophy noted, no ankle clonus bilaterally Sensory: Symmetric sensation on both sides Deep Tendon Reflexes: 2+ over both patella,  Plantars: Right: downgoing   Left: downgoing Cerebellar: Dysmetria in the right greater than the left      Lab Results: Basic Metabolic Panel: Recent Labs  Lab 04/07/19 2214 04/08/19 0500 04/09/19 0316  NA 152* 144 144  K 4.1 3.9 3.6  CL 113* 104 104  CO2 27 28 27   GLUCOSE 100* 160* 102*  BUN 53* 48* 30*  CREATININE 1.17 1.07 1.02  CALCIUM 10.5* 9.8 9.7  PHOS  --  3.4  --     CBC: Recent Labs  Lab 04/07/19 2214 04/08/19 0500 04/09/19 0316  WBC 11.5* 9.8 9.1  NEUTROABS 9.3*  --   --   HGB 12.6* 11.7* 12.2*  HCT  41.4 38.2* 38.7*  MCV 91.4 92.7 89.4  PLT 238 260 298    Coagulation Studies: No results for input(s): LABPROT, INR in the last 72 hours.  Imaging: Ct Head Wo Contrast  Result Date: 04/07/2019 CLINICAL DATA:  Patient status post fall today.  Initial encounter. EXAM: CT HEAD WITHOUT CONTRAST TECHNIQUE: Contiguous axial images were obtained from the base of the skull through the vertex without intravenous contrast. COMPARISON:  Head CT scan 02/18/2019. FINDINGS: Brain: No evidence of acute infarction, hemorrhage, hydrocephalus, extra-axial collection or mass lesion/mass effect. Atrophy, chronic microvascular ischemic change, remote bilateral cerebellar infarcts and bilateral basal ganglia infarcts all again seen. Vascular: Extensive atherosclerosis.  No hyperdense vessel. Skull: Intact.  No focal lesion. Sinuses/Orbits: Mucous retention cyst or polyp left maxillary sinus noted. Other: None. IMPRESSION: No acute intracranial abnormality. Atrophy, chronic microvascular ischemic change remote infarcts as described above. Extensive atherosclerosis. Electronically Signed   By: Drusilla Kannerhomas  Dalessio M.D.   On: 04/07/2019 21:17   Mr Brain Wo Contrast  Result Date: 04/08/2019 CLINICAL DATA:  Subacute neuro deficit.  Altered mental  status. EXAM: MRI HEAD WITHOUT CONTRAST TECHNIQUE: Multiplanar, multiecho pulse sequences of the brain and surrounding structures were obtained without intravenous contrast. COMPARISON:  CT head 04/07/2019 FINDINGS: Brain: Moderate atrophy. Extensive chronic ischemic changes in the white matter and thalamus and basal ganglia bilaterally. Chronic lacunar infarction left pons with mild ischemic change in the right pons. Chronic hemorrhagic infarct right cerebellum. Mild chronic ischemia in the left cerebellum. Chronic microhemorrhage left parietal cortex and left posterior thalamus. Chronic hemorrhage in the right cerebellar infarct. Motion degraded study. Possible small area of acute infarct  in the posterior external capsule on the left. This is only seen on axial diffusion and not confirmed on coronal diffusion. Vascular: Normal vascular flow voids Skull and upper cervical spine: Negative Sinuses/Orbits: Mucosal edema paranasal sinuses. Bilateral cataract surgery Other: None IMPRESSION: Motion degraded study. Possible small acute infarct posterior external capsule left Atrophy and advanced chronic ischemic changes as above. Electronically Signed   By: Marlan Palau M.D.   On: 04/08/2019 10:15   Dg Chest Portable 1 View  Result Date: 04/07/2019 CLINICAL DATA:  Altered mental status EXAM: PORTABLE CHEST 1 VIEW COMPARISON:  12/16/2007 FINDINGS: Hyperinflated lungs. No focal airspace disease or effusion. Normal cardiomediastinal silhouette. No pneumothorax. Stable right apically pleural and parenchymal scarring. IMPRESSION: No active disease.  Pulmonary hyperinflation Electronically Signed   By: Jasmine Pang M.D.   On: 04/07/2019 19:47     ASSESSMENT AND PLAN   73 y.o. male with past medical history significant for hypertension, hyperlipidemia, prior stroke with residual right hemiparesis presents to the emergency department for altered mental status and worsening speech. Reviewed MRI brain, very difficult to a certain whether this is an acute stroke versus artifact.  However patient does have uncontrolled risk factors.  Possible Acute Ischemic Stroke   Recommend #MRA Head and neck  #Transthoracic Echo  #Continue ASA 325mg  daily #Start or continue Atorvastatin 40 mg/other high intensity statin # BP goal: permissive HTN upto 220/120 mmHg ( 185/110 if patient has CHF, CKD) # HBAIC and Lipid profile # Telemetry monitoring # Frequent neuro checks # stroke swallow screen # PT/OT  Please page stroke NP  Or  PA  Or MD from 8am -4 pm  as this patient from this time will be  followed by the stroke.   You can look them up on [email or web address]  Password [facility]     Yliana Gravois [facility] Pager Number [phone]

## 2019-04-09 NOTE — Evaluation (Signed)
Clinical/Bedside Swallow Evaluation Patient Details  Name: Raymond Knight MRN: [id number] Date of Birth: 1946/01/04  Today's Date: 04/09/2019 Time: SLP Start Time (ACUTE ONLY): 1212 SLP Stop Time (ACUTE ONLY): 1232 SLP Time Calculation (min) (ACUTE ONLY): 20 min  Past Medical History:  Past Medical History:  Diagnosis Date  . Anemia   . Dysarthria   . Hyperlipidemia   . Hypertension   . Hyponatremia   . Renal insufficiency   . Stroke [facility]) 2007   Past Surgical History:  Past Surgical History:  Procedure Laterality Date  . colonoscopy with biopsy  12/18/2007  . ESOPHAGOGASTRODUODENOSCOPY ENDOSCOPY  12/18/2007  . ORIF Left Hip fracture  01/23/2007   HPI:  Pt is a 73 yo male s/p MRI revealing possible L posterior external capsule. PMHx: HTN, HLD, CVA R residual weakness, ORIF L hip.   Assessment / Plan / Recommendation Clinical Impression   Pt presents with increased aspiration risk in the setting of decreased postural control and cognitive impairment which impact his ability to control boluses for safe swallowing.  Upon arrival, pt was laying in bed curled up on his left side and would revert to this position despite frequent attempts by therapist to reposition.  As a result, pt had left sided labial spillage of boluses from the oral cavity.  Pt also had immediate coughing with thin liquids when consumed in large amounts.  Small amounts of water were tolerated without overt s/s of aspiration as were nectar thick liquids.  Pt has history of dysphagia from previous CVA and was seen by SLP in 2009 for an instrumental assessment of swallowing, recommending dys 3, nectar thick liquids.  Pt declined solids during today's evaluation and is missing almost all of his top teeth.  Unclear whether pt has dentures at home but none were present during today's evaluation.  As a result, recommend that pt be initiated on a dys 1, nectar thick liquids diet.  Please allow pt small sips of water in between  meals after oral care to maximize pt hydration and to continue working towards diet advancement.   SLP Visit Diagnosis: Dysphagia, oropharyngeal phase (R13.12)    Aspiration Risk  Moderate aspiration risk    Diet Recommendation Dysphagia 1 (Puree);Nectar-thick liquid   Liquid Administration via: Cup Medication Administration: Crushed with puree Supervision: Full supervision/cueing for compensatory strategies Compensations: Minimize environmental distractions;Slow rate;Small sips/bites Postural Changes: Seated upright at 90 degrees    Other  Recommendations Oral Care Recommendations: Oral care BID   Follow up Recommendations 24 hour supervision/assistance;Skilled Nursing facility      Frequency and Duration min 2x/week          Prognosis Prognosis for Safe Diet Advancement: Fair Barriers to Reach Goals: Cognitive deficits      Swallow Study   General Date of Onset: (see HPI) HPI: Pt is a 73 yo male s/p MRI revealing possible L posterior external capsule. PMHx: HTN, HLD, CVA R residual weakness, ORIF L hip. Type of Study: Bedside Swallow Evaluation Previous Swallow Assessment: MBS 2009, rec dys 3, nectar thick liquids Diet Prior to this Study: NPO Temperature Spikes Noted: No Respiratory Status: Room air History of Recent Intubation: No Behavior/Cognition: Alert;Cooperative;Pleasant mood;Confused Oral Care Completed by SLP: Yes Oral Cavity - Dentition: Missing dentition;Poor condition Vision: Functional for self-feeding Self-Feeding Abilities: Able to feed self;Needs assist Patient Positioning: Upright in bed Baseline Vocal Quality: Hoarse;Breathy Volitional Cough: Weak    Oral/Motor/Sensory Function Overall Oral Motor/Sensory Function: Generalized oral weakness   Ice Chips Ice  chips: Impaired Presentation: Spoon Oral Phase Impairments: Impaired mastication Oral Phase Functional Implications: Prolonged oral transit   Thin Liquid Thin Liquid: Impaired Presentation:  Cup;Straw Oral Phase Impairments: Reduced labial seal Oral Phase Functional Implications: Left anterior spillage Pharyngeal  Phase Impairments: Cough - Immediate    Nectar Thick Nectar Thick Liquid: Impaired Presentation: Cup Oral Phase Impairments: Reduced labial seal Oral phase functional implications: Left anterior spillage Pharyngeal Phase Impairments: Cough - Immediate   Honey Thick     Puree Puree: Within functional limits   Solid            Raymond Knight, Raymond Knight L 04/09/2019,1:45 PM

## 2019-04-09 NOTE — Evaluation (Signed)
Physical Therapy Evaluation Patient Details Name: Raymond Knight MRN: [id number] DOB: [birth year] Today's Date: 04/09/2019   History of Present Illness  Pt is a 73 yo male s/p MRI revealing possible L posterior external capsule. PMHx: HTN, HLD, CVA R residual weakness, ORIF L hip.  Clinical Impression  Pt admitted with/for signs of stroke, with likelihood based on imaging and presentation.  Pt's mobility is uncoordinated, with right weaker than left.  Pt generally needing maximal assist for basic mobility. .  Pt currently limited functionally due to the problems listed. ( See problems list.)   Pt will benefit from PT to maximize function and safety in order to get ready for next venue listed below.     Follow Up Recommendations SNF;Supervision/Assistance - 24 hour    Equipment Recommendations  Other (comment)(TBA)    Recommendations for Other Services       Precautions / Restrictions Precautions Precautions: Fall Restrictions Weight Bearing Restrictions: No      Mobility  Bed Mobility Overal bed mobility: Needs Assistance Bed Mobility: Sidelying to Sit;Sit to Sidelying   Sidelying to sit: Min assist     Sit to sidelying: Mod assist General bed mobility comments: MinA for trunk elevation for sidelying on L side to sitting with LUE using side rail; sitting to sidelying: pt requires modA for BLEs to move up on bed.   pt attempted to assist scooting to EOB, but needed mod assist with pad to come forward significant.y  Transfers Overall transfer level: Needs assistance Equipment used: Rolling walker (2 wheeled);None Transfers: Sit to/from [employer] Sit to Stand: From elevated surface;Max assist(to submaximal stance)   Squat pivot transfers: Max assist(bed to/from bedside chair.)     General transfer comment: pt need cues and assist to come forward.  UE's more resistant in extension than helpful to assist with squat pivot.  Ambulation/Gait              General Gait Details: unable today and likely not ambulatory.  Family did not answer the phone for clarification.  Stairs            Wheelchair Mobility    Modified Rankin (Stroke Patients Only)       Balance Overall balance assessment: Needs assistance Sitting-balance support: Single extremity supported;Feet supported Sitting balance-[name]: Fair       Standing balance-[name]: Poor Standing balance comment: reliance on external support.                             Pertinent Vitals/Pain Pain Assessment: Faces Faces Pain Scale: No hurt Pain Descriptors / Indicators: Burning Pain Intervention(s): Monitored during session    Home Living Family/patient expects to be discharged to:: Private residence Living Arrangements: Spouse/significant other Available Help at Discharge: Family;Available 24 hours/day Type of Home: House Home Access: Level entry     Home Layout: One level Home Equipment: Wheelchair - [employer] - 2 wheels      Prior Function Level of Independence: Needs assistance   Gait / Transfers Assistance Needed: pt reports wife is his main help. pt is not a good historian.  He agreed that a RW was used and a w/c was used to get to other parts of the house.  Pt stated he doesn't leave the house.  ADL's / Homemaking Assistance Needed: Pt reports requiring assist with bathing/dressing        Hand Dominance   Dominant Hand: Left    Extremity/Trunk  Assessment   Upper Extremity Assessment Upper Extremity Assessment: Defer to OT evaluation(During PT eval, pt moved bil against gravity, but weak/incoo) RUE Deficits / Details: 2-/5 MM grade, weakness and poor coordination RUE Coordination: decreased fine motor;decreased gross motor LUE Deficits / Details: 3/5 MM grade    Lower Extremity Assessment Lower Extremity Assessment: RLE deficits/detail;LLE deficits/detail;Generalized weakness RLE Deficits / Details: bil mild knee  flexion contracture to -30* ext lag Active assist.  grossly 2/5 ext , but assisted in stance for sub maximal stance and squat pivot transfer. RLE Coordination: decreased gross motor LLE Deficits / Details: grossly 3-/5 extension,  incoordinated, but stronger overalll than right LE       Communication   Communication: Expressive difficulties(slow to answer and low speaking volume)  Cognition Arousal/Alertness: Awake/alert Behavior During Therapy: WFL for tasks assessed/performed Overall Cognitive Status: No family/caregiver present to determine baseline cognitive functioning                                 General Comments: Pt stating differing facts throughout session explained in ADL portion. Pt requiring increased time and only 1 question at a time. Pt not answering all questions at once. pt would answer 1 question after a long pause or else would rustle with his sheets and lines.      General Comments      Exercises     Assessment/Plan    PT Assessment Patient needs continued PT services  PT Problem List Decreased strength;Decreased activity tolerance;Decreased balance;Decreased mobility;Decreased coordination;Decreased safety awareness       PT Treatment Interventions DME instruction;Functional mobility training;Therapeutic activities;Balance training;Neuromuscular re-education;Patient/family education    PT Goals (Current goals can be found in the Care Plan section)  Acute Rehab PT Goals Patient Stated Goal: none stated PT Goal Formulation: Patient unable to participate in goal setting Time For Goal Achievement: 04/23/19 Potential to Achieve Goals: Good    Frequency Min 3X/week   Barriers to discharge Decreased caregiver support      Co-evaluation               AM-PAC PT "6 Clicks" Mobility  Outcome Measure Help needed turning from your back to your side while in a flat bed without using bedrails?: A Lot Help needed moving from lying on your  back to sitting on the side of a flat bed without using bedrails?: Total Help needed moving to and from a bed to a chair (including a wheelchair)?: Total Help needed standing up from a chair using your arms (e.g., wheelchair or bedside chair)?: Total Help needed to walk in hospital room?: Total Help needed climbing 3-5 steps with a railing? 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

## 2019-04-09 NOTE — Plan of Care (Signed)
  Problem: Nutrition: Goal: Risk of aspiration will decrease Outcome: Progressing Goal: Dietary intake will improve Outcome: Progressing   Problem: Ischemic Stroke/TIA Tissue Perfusion: Goal: Complications of ischemic stroke/TIA will be minimized Outcome: Progressing   

## 2019-04-09 NOTE — Progress Notes (Signed)
SLP Cancellation Note  Patient Details Name: Raymond Knight MRN: [id number] DOB: [birth year]   Cancelled treatment:       Reason Eval/Treat Not Completed: Patient at procedure or test/unavailable  Pt undergoing Echo.  Will continue efforts for evaluation as pt is available.     Zo Loudon, Selinda Orion 04/09/2019, 8:33 AM

## 2019-04-09 NOTE — Progress Notes (Signed)
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  Apparently, patient's daughter went to visit their parents, wife did not want patient to come to the hospital, she found him speaking minimally and more slurred.  In the emergency room, he was found with hyponatremia, sodium of 152, dehydration and MRI brain showed possible infarct in the left posterior external capsule.   Assessment & Plan:   Active Problems:   Hypernatremia   Altered mental status   History of CVA (cerebrovascular accident)   Hypercalcemia   CVA (cerebral vascular accident) (HCC)  Acute ischemic stroke: With history of a stroke and right hemiparesis.  Acute metabolic encephalopathy due to stroke and electrolyte abnormalities. Clinical findings, more confusion, recurrent fall and dysarthria and suspected right hemiparesis. CT head findings, negative. MRI of the brain, probable small acute infarct posterior external capsule.  Previous advanced ischemic changes.  Chronic hemorrhage right cerebral infarct present on MRI from 2005 with acute cerebral hemorrhage. Carotid Doppler, pending. 2D echocardiogram, pending. Antiplatelet therapy, not on any treatment at home.  Currently on aspirin. LDL 59, no indication for statin. Hemoglobin A1c, 5.7.  No indication for treatment. DVT prophylaxis, SCDs. Therapy recommendations, skilled nursing facility.  Hypernatremia: Due to dehydration.  Free water deficit.  Treated with dextrose infusion.  Improved and normalized.  Will recheck in the morning to ensure stabilization.  Hypercalcemia: Due to dehydration.  Improved and normalized.  Recheck in the morning to ensure stabilization.  Abnormal  urinalysis: Insignificant growth.  No indication for antibiotics.  Multiple skin tears present on admission: Skin tear at buttock, skin tear left knee, skin tear left medial side of the toe.  Local wound care.   DVT prophylaxis: SCDs Code Status: Full code Family Communication: None Disposition Plan: SNF   Consultants:   Neurology  Procedures:   None  Antimicrobials:   None   Subjective: Patient seen and examined.  Poor historian.  He was not sure he has weakness on one side or the other side.  He does have dysarthria.  He does follow commands and moves upper extremities, however he does not move lower extremities.  Objective: Vitals:   04/09/19 0200 04/09/19 0334 04/09/19 1039 04/09/19 1100  BP: 132/84 (!) 148/71  129/77  Pulse: (!) 119 (!) 120 93   Resp: 15 (!) 24    Temp:  98.1 F (36.7 C) (!) 97.5 F (36.4 C)   TempSrc:  Oral Oral   SpO2: 100% 93% 100%   Weight:        Intake/Output Summary (Last 24 hours) at 04/09/2019 1152 Last data filed at 04/09/2019 0458 Gross per 24 hour  Intake 407.05 ml  Output 100 ml  Net 307.05 ml   Filed Weights   04/08/19 0016 04/08/19 0017  Weight: 49.9 kg 49.7 kg    Examination:  General exam: Appears calm and comfortable, chronically sick looking.  Dysarthric. Respiratory system: Clear to auscultation. Respiratory effort normal. Cardiovascular system: S1 & S2 heard, RRR. No JVD, murmurs, rubs, gallops or clicks. No pedal edema. Gastrointestinal system: Abdomen is nondistended, soft and nontender. No organomegaly or masses felt. Normal bowel sounds heard. Central nervous system: Alert and awake.  Oriented x2.   Mild right facial droop. Both upper extremities, 4/ 5. Both lower  extremities 3/5.  Poorly moving. Skin: No rashes, lesions or ulcers, multiple skin tears. Psychiatry: Judgement and insight appear normal. Mood & affect flat.    Data Reviewed: I have personally reviewed following labs and imaging  studies  CBC: Recent Labs  Lab 04/07/19 2214 04/08/19 0500 04/09/19 0316  WBC 11.5* 9.8 9.1  NEUTROABS 9.3*  --   --   HGB 12.6* 11.7* 12.2*  HCT 41.4 38.2* 38.7*  MCV 91.4 92.7 89.4  PLT 238 260 298   Basic Metabolic Panel: Recent Labs  Lab 04/07/19 2214 04/08/19 0500 04/09/19 0316  NA 152* 144 144  K 4.1 3.9 3.6  CL 113* 104 104  CO2 27 28 27   GLUCOSE 100* 160* 102*  BUN 53* 48* 30*  CREATININE 1.17 1.07 1.02  CALCIUM 10.5* 9.8 9.7  PHOS  --  3.4  --    GFR: CrCl cannot be calculated (Unknown ideal weight.). Liver Function Tests: Recent Labs  Lab 04/07/19 2214 04/08/19 0500 04/09/19 0316  AST 67*  --  50*  ALT 62*  --  50*  ALKPHOS 66  --  59  BILITOT 0.9  --  0.5  PROT 8.1  --  7.3  ALBUMIN 3.5 3.1* 2.8*   No results for input(s): LIPASE, AMYLASE in the last 168 hours. No results for input(s): AMMONIA in the last 168 hours. Coagulation Profile: No results for input(s): INR, PROTIME in the last 168 hours. Cardiac Enzymes: No results for input(s): CKTOTAL, CKMB, CKMBINDEX, TROPONINI in the last 168 hours. BNP (last 3 results) No results for input(s): PROBNP in the last 8760 hours. HbA1C: Recent Labs    04/09/19 0316  HGBA1C 5.7*   CBG: Recent Labs  Lab 04/08/19 1716  GLUCAP 102*   Lipid Profile: Recent Labs    04/09/19 0316  CHOL 124  HDL 36*  LDLCALC 59  TRIG 469  CHOLHDL 3.4   Thyroid Function Tests: No results for input(s): TSH, T4TOTAL, FREET4, T3FREE, THYROIDAB in the last 72 hours. Anemia Panel: No results for input(s): VITAMINB12, FOLATE, FERRITIN, TIBC, IRON, RETICCTPCT in the last 72 hours. Sepsis Labs: No results for input(s): PROCALCITON, LATICACIDVEN in the last 168 hours.  Recent Results (from the past 240 hour(s))  SARS Coronavirus 2 [facility] order, Performed in [facility] hospital lab) Nasopharyngeal Nasopharyngeal Swab     Status: None   Collection Time: 04/07/19  7:10 PM   Specimen: Nasopharyngeal Swab  Result  Value Ref Range Status   SARS Coronavirus 2 NEGATIVE NEGATIVE Final    Comment: (NOTE) If result is NEGATIVE SARS-CoV-2 target nucleic acids are NOT DETECTED. The SARS-CoV-2 RNA is generally detectable in upper and lower  respiratory specimens during the acute phase of infection. The lowest  concentration of SARS-CoV-2 viral copies this assay can detect is 250  copies / mL. A negative result does not preclude SARS-CoV-2 infection  and should not be used as the sole basis for treatment or other  patient management decisions.  A negative result may occur with  improper specimen collection / handling, submission of specimen other  than nasopharyngeal swab, presence of viral mutation(s) within the  areas targeted by this assay, and inadequate number of viral copies  (<250 copies / mL). A negative result must be combined with clinical  observations, patient history, and epidemiological information. If result is POSITIVE SARS-CoV-2 target nucleic acids are DETECTED. The SARS-CoV-2 RNA is generally detectable in upper and lower  respiratory specimens dur ing the acute phase of infection.  Positive  results are indicative of active infection with SARS-CoV-2.  Clinical  correlation with patient history and other diagnostic information is  necessary to determine patient infection status.  Positive results do  not rule out bacterial infection or co-infection with other viruses. If result is PRESUMPTIVE POSTIVE SARS-CoV-2 nucleic acids MAY BE PRESENT.   A presumptive positive result was obtained on the submitted specimen  and confirmed on repeat testing.  While 2019 novel coronavirus  (SARS-CoV-2) nucleic acids may be present in the submitted sample  additional confirmatory testing may be necessary for epidemiological  and / or clinical management purposes  to differentiate between  SARS-CoV-2 and other Sarbecovirus currently known to infect humans.  If clinically indicated additional testing  with an alternate test  methodology [phone]) is advised. The SARS-CoV-2 RNA is generally  detectable in upper and lower respiratory sp ecimens during the acute  phase of infection. The expected result is Negative. Fact Sheet for Patients:  BoilerBrush.com.cy Fact Sheet for Healthcare Providers: https://pope.com/ This test is not yet approved or cleared by the [place] FDA and has been authorized for detection and/or diagnosis of SARS-CoV-2 by FDA under an Emergency Use Authorization (EUA).  This EUA will remain in effect (meaning this test can be used) for the duration of the COVID-19 declaration under Section 564(b)(1) of the Act, 21 U.S.C. section 360bbb-3(b)(1), unless the authorization is terminated or revoked sooner. Performed at [facility], [address] [place], [place] [address]   Culture, Urine     Status: Abnormal   Collection Time: 04/08/19  2:27 AM   Specimen: Urine, Clean Catch  Result Value Ref Range Status   Specimen Description   Final    URINE, CLEAN CATCH Performed at [facility], [address], [place], [place] [address]    Special Requests   Final    NONE Performed at [facility], [address], [place], [place] [address]    Culture (A)  Final    30,000 COLONIES/mL MULTIPLE SPECIES PRESENT, SUGGEST RECOLLECTION   Report Status 04/09/2019 FINAL  Final         Radiology Studies: Ct Head Wo Contrast  Result Date: 04/07/2019 CLINICAL DATA:  Patient status post fall today.  Initial encounter. EXAM: CT HEAD WITHOUT CONTRAST TECHNIQUE: Contiguous axial images were obtained from the base of the skull through the vertex without intravenous contrast. COMPARISON:  Head CT scan 02/18/2019. FINDINGS: Brain: No evidence of acute infarction, hemorrhage, hydrocephalus, extra-axial collection or mass lesion/mass effect. Atrophy, chronic microvascular  ischemic change, remote bilateral cerebellar infarcts and bilateral basal ganglia infarcts all again seen. Vascular: Extensive atherosclerosis.  No hyperdense vessel. Skull: Intact.  No focal lesion. Sinuses/Orbits: Mucous retention cyst or polyp left maxillary sinus noted. Other: None. IMPRESSION: No acute intracranial abnormality. Atrophy, chronic microvascular ischemic change remote infarcts as described above. Extensive atherosclerosis. Electronically Signed   By: Drusilla Kanner M.D.   On: 04/07/2019 21:17   Mr Brain Wo Contrast  Result Date: 04/08/2019 CLINICAL DATA:  Subacute neuro deficit.  Altered mental status. EXAM: MRI HEAD WITHOUT CONTRAST TECHNIQUE: Multiplanar, multiecho pulse sequences of the brain and surrounding structures were obtained without intravenous contrast. COMPARISON:  CT head 04/07/2019 FINDINGS: Brain: Moderate atrophy. Extensive chronic ischemic changes in the white matter and thalamus and basal ganglia bilaterally. Chronic lacunar infarction left pons with mild ischemic change in the right pons. Chronic hemorrhagic infarct right cerebellum. Mild chronic ischemia in the left cerebellum. Chronic microhemorrhage left  parietal cortex and left posterior thalamus. Chronic hemorrhage in the right cerebellar infarct. Motion degraded study. Possible small area of acute infarct in the posterior external capsule on the left. This is only seen on axial diffusion and not confirmed on coronal diffusion. Vascular: Normal vascular flow voids Skull and upper cervical spine: Negative Sinuses/Orbits: Mucosal edema paranasal sinuses. Bilateral cataract surgery Other: None IMPRESSION: Motion degraded study. Possible small acute infarct posterior external capsule left Atrophy and advanced chronic ischemic changes as above. Electronically Signed   By: Marlan Palau M.D.   On: 04/08/2019 10:15   Dg Chest Portable 1 View  Result Date: 04/07/2019 CLINICAL DATA:  Altered mental status EXAM: PORTABLE  CHEST 1 VIEW COMPARISON:  12/16/2007 FINDINGS: Hyperinflated lungs. No focal airspace disease or effusion. Normal cardiomediastinal silhouette. No pneumothorax. Stable right apically pleural and parenchymal scarring. IMPRESSION: No active disease.  Pulmonary hyperinflation Electronically Signed   By: Jasmine Pang M.D.   On: 04/07/2019 19:47   Vas US Carotid (at [facility])  Result Date: 04/09/2019 Carotid Arterial Duplex Study Indications:       CVA. Risk Factors:      Hypertension, hyperlipidemia. Comparison Study:  No prior studies. Performing Technologist: Gertie Fey MHA, RDMS, RVT, RDCS  Examination Guidelines: A complete evaluation includes B-mode imaging, spectral Doppler, color Doppler, and power Doppler as needed of all accessible portions of each vessel. Bilateral testing is considered an integral part of a complete examination. Limited examinations for reoccurring indications may be performed as noted.  Right Carotid Findings: +----------+--------+--------+--------+---------------------+------------------+             PSV cm/s EDV cm/s Stenosis Plaque Description    Comments            +----------+--------+--------+--------+---------------------+------------------+  CCA Prox   79       20                                      intimal thickening  +----------+--------+--------+--------+---------------------+------------------+  CCA Distal 90       20                homogeneous and                                                                  smooth                                    +----------+--------+--------+--------+---------------------+------------------+  ICA Prox   42       15                smooth, heterogenous                                                             and calcific                              +----------+--------+--------+--------+---------------------+------------------+  ICA Distal 65       21                smooth and                                                                        homogeneous                               +----------+--------+--------+--------+---------------------+------------------+  ECA        86       9                 heterogenous, focal                                                              and irregular                             +----------+--------+--------+--------+---------------------+------------------+ +----------+--------+-------+----------------+-------------------+             PSV cm/s EDV cms Describe         Arm Pressure (mmHG)  +----------+--------+-------+----------------+-------------------+  Subclavian 72               Multiphasic, WNL                      +----------+--------+-------+----------------+-------------------+ +---------+--------+--+--------+-+---------+  Vertebral PSV cm/s 25 EDV cm/s 6 Antegrade  +---------+--------+--+--------+-+---------+  Left Carotid Findings: +----------+--------+--------+--------+---------------------+------------------+             PSV cm/s EDV cm/s Stenosis Plaque Description    Comments            +----------+--------+--------+--------+---------------------+------------------+  CCA Prox   104      19                                      intimal thickening  +----------+--------+--------+--------+---------------------+------------------+  CCA Distal 49       11                smooth and                                                                       heterogenous                              +----------+--------+--------+--------+---------------------+------------------+  ICA Prox   35       9                 irregular,  heterogenous and                                                                 calcific                                  +----------+--------+--------+--------+---------------------+------------------+  ICA Distal 42       11                                                           +----------+--------+--------+--------+---------------------+------------------+  ECA        45                         smooth and                                                                       heterogenous                              +----------+--------+--------+--------+---------------------+------------------+ +----------+--------+--------+----------------+-------------------+             PSV cm/s EDV cm/s Describe         Arm Pressure (mmHG)  +----------+--------+--------+----------------+-------------------+  Subclavian 75                Multiphasic, WNL                      +----------+--------+--------+----------------+-------------------+ +---------+--------+--+--------+--+---------+  Vertebral PSV cm/s 38 EDV cm/s 11 Antegrade  +---------+--------+--+--------+--+---------+  Summary: Right Carotid: Velocities in the right ICA are consistent with a 1-39% stenosis. Left Carotid: Velocities in the left ICA are consistent with a 1-39% stenosis. Vertebrals:  Bilateral vertebral arteries demonstrate antegrade flow. Subclavians: Normal flow hemodynamics were seen in bilateral subclavian              arteries. *See table(s) above for measurements and observations.     Preliminary    Vas [place] Transcranial Doppler  Result Date: 04/09/2019  Transcranial Doppler Indications: Stroke. History: Hypertension, hyperlipidemia. Limitations: Poor acoustic windows Limitations for diagnostic windows: Unable to insonate left transtemporal window. Unable to insonate occipital window. Comparison Study: No prior study. Performing Technologist: Gertie Fey MHA, RDMS, RVT, RDCS  Examination Guidelines: A complete evaluation includes B-mode imaging, spectral Doppler, color Doppler, and power Doppler as needed of all accessible portions of each vessel. Bilateral testing is considered an integral part of a complete examination. Limited examinations for reoccurring indications may be performed as noted.   +---------+-------------+----------+-----------+-------+  RIGHT TCD Right VM (cm) Depth (cm) Pulsatility Comment  +---------+-------------+----------+-----------+-------+  MCA           51.00  1.16              +---------+-------------+----------+-----------+-------+  ACA          -45.00                   0.91              +---------+-------------+----------+-----------+-------+  Term ICA      38.00                   1.17              +---------+-------------+----------+-----------+-------+  PCA           19.00                   0.85              +---------+-------------+----------+-----------+-------+  Opthalmic     14.00                   1.38              +---------+-------------+----------+-----------+-------+  +---------+------------+----------+-----------+-------+  LEFT TCD  Left VM (cm) Depth (cm) Pulsatility Comment  +---------+------------+----------+-----------+-------+  Opthalmic    15.00                   1.55              +---------+------------+----------+-----------+-------+     Preliminary         Scheduled Meds:  aspirin EC  81 mg Oral Daily   Continuous Infusions:   LOS: 1 day    Time spent: 35 minutes    Dorcas Carrow, MD [facility] Pager [phone]  If 7PM-7AM, please contact night-coverage [email or web address] Password [email or web address] 04/09/2019, 11:52 AM

## 2019-04-09 NOTE — Progress Notes (Signed)
  Echocardiogram 2D Echocardiogram has been performed.  Raymond Knight 04/09/2019, 9:06 AM

## 2019-04-09 NOTE — Consult Note (Signed)
[facility] Nurse wound consult note Reason for Consult:Skin tears to arms, legs.  History stroke with right sided hemiparesis.  Wound type:trauma Pressure Injury POA: NA Measurement: scattered 0.4 cm linear skin tears. Wound bed: pink and moist Drainage (amount, consistency, odor) minimal weeping Periwound:bruising Dressing procedure/placement/frequency: Cleanse skin tears with NS and pat dry. Apply silicone contact layer for atraumatic dressing removal .  Secure with dry gauze and kerlix/tape.  Change Tuesday and Friday.  Will not follow at this time.  Please re-consult if needed.  Domenic Moras MSN, RN, FNP-BC [facility] Wound, Ostomy, Continence Nurse Pager [phone]

## 2019-04-09 NOTE — Evaluation (Signed)
Occupational Therapy Evaluation Patient Details Name: Raymond Knight MRN: [id number] DOB: [birth year] Today's Date: 04/09/2019    History of Present Illness Pt is a 73 yo male s/p MRI revealing possible L posterior external capsule. PMHx: HTN, HLD, CVA R residual weakness, ORIF L hip.   Clinical Impression   Pt PTA: Not quite sure. Currently, Pt requires increased assist for ADL for UB/LB. Pt performing tasks with decreased independence. L side stronger >R side. Pt getting different answers for mobility and ADL questions. Pt reports that his spouse assists him and that he uses a RW, but with +2 attempts for standing, pt unable to power up with knees bent without totalA due to weakness. Pt then reports he does not stand. Pt modA overal for ADL and modA for bed mobility overall. Pt with R side weakness, cognitive deficits and delayed response with poor attention. Pt would greatly benefit from continued OT skilled services. Pt did not report any falls, but scrapes and open places on feet and L hip, b/l knees present from skin tears- bandaged now. OT following acutely.    Follow Up Recommendations  SNF;Supervision/Assistance - 24 hour    Equipment Recommendations  Other (comment)(to be determined)    Recommendations for Other Services       Precautions / Restrictions Precautions Precautions: Fall Restrictions Weight Bearing Restrictions: No      Mobility Bed Mobility Overal bed mobility: Needs Assistance Bed Mobility: Sidelying to Sit;Sit to Sidelying   Sidelying to sit: Min assist     Sit to sidelying: Mod assist General bed mobility comments: MinA for trunk elevation for sidelying on L side to sitting with LUE using side rail; sitting to sidelying: pt requires modA for BLEs to move up on bed.   Transfers Overall transfer level: Needs assistance   Transfers: Sit to/from Stand Sit to Stand: Total assist;+2 physical assistance;+2 safety/equipment;From elevated surface         General transfer comment: Pt with knees bent and unable to come to full standing due to R sided weakness and R knee blocked.    Balance Overall balance assessment: Needs assistance Sitting-balance support: Single extremity supported;Feet supported Sitting balance-[name]: Fair                                     ADL either performed or assessed with clinical judgement   ADL Overall ADL's : Needs assistance/impaired Eating/Feeding: Set up;Sitting   Grooming: Set up;Sitting   Upper Body Bathing: Minimal assistance;Sitting   Lower Body Bathing: Moderate assistance;Sitting/lateral leans;Sit to/from stand   Upper Body Dressing : Minimal assistance;Sitting;Cueing for sequencing   Lower Body Dressing: Moderate assistance;Sitting/lateral leans;Bed level Lower Body Dressing Details (indicate cue type and reason): Pt leaning over to fix LLE sock with LE mostly (in sitting) Toilet Transfer: Total assistance Toilet Transfer Details (indicate cue type and reason): condom cath; unable to stand Toileting- Clothing Manipulation and Hygiene: Total assistance       Functional mobility during ADLs: Total assistance General ADL Comments: Pt requires increased assist for ADL for UB/LB. Pt performing tasks with decreased independence. L side stronger >R side. Pt getting different answers for mobility and ADL questions. Pt repots that his spouse assists him and that he uses a RW, but with +2 attempts for standing., pt unable to power up without totalA due to weakness. Pt then reports he does not stand.     Vision Patient  Visual Report: No change from baseline Vision Assessment?: Vision impaired- to be further tested in functional context Additional Comments: able to track OT in room and some scanning, but decreased attention span     Perception     Praxis      Pertinent Vitals/Pain Pain Assessment: No/denies pain     Hand Dominance Left   Extremity/Trunk Assessment  Upper Extremity Assessment Upper Extremity Assessment: Generalized weakness;RUE deficits/detail;LUE deficits/detail RUE Deficits / Details: 2-/5 MM grade, weakness and poor coordination RUE Coordination: decreased fine motor;decreased gross motor LUE Deficits / Details: 3/5 MM grade   Lower Extremity Assessment Lower Extremity Assessment: Defer to PT evaluation;RLE deficits/detail RLE Deficits / Details: decreased strength -p thaving to lift RLE for movement with BUEs RLE Coordination: decreased gross motor       Communication Communication Communication: Expressive difficulties(slow to answer and low speaking volume)   Cognition Arousal/Alertness: Awake/alert Behavior During Therapy: WFL for tasks assessed/performed Overall Cognitive Status: No family/caregiver present to determine baseline cognitive functioning                                 General Comments: Pt stating differing facts throughout session explained in ADL portion. Pt requiring increased time and only 1 question at a time. Pt not answering all questions at once. pt would answer 1 question after a long pause or else would rustle with his sheets and lines.   General Comments       Exercises     Shoulder Instructions      Home Living Family/patient expects to be discharged to:: Private residence Living Arrangements: Spouse/significant other Available Help at Discharge: Family;Available 24 hours/day Type of Home: House Home Access: Level entry     Home Layout: One level     Bathroom Shower/Tub: Chief Strategy Officer: Handicapped height     Home Equipment: Wheelchair - [employer] - 2 wheels          Prior Functioning/Environment Level of Independence: Needs assistance  Gait / Transfers Assistance Needed: reports using a walker at home- unsure of accuracy info form pt ADL's / Homemaking Assistance Needed: Pt reports requiring assist with bathing/dressing Communication /  Swallowing Assistance Needed: low voice          OT Problem List: Decreased strength;Decreased activity tolerance;Impaired balance (sitting and/or standing);Decreased coordination;Impaired UE functional use      OT Treatment/Interventions: Self-care/ADL training;Therapeutic exercise;Neuromuscular education;Energy conservation;Therapeutic activities;Cognitive remediation/compensation;Patient/family education;Balance training;Visual/perceptual remediation/compensation    OT Goals(Current goals can be found in the care plan section) Acute Rehab OT Goals Patient Stated Goal: none stated OT Goal Formulation: Patient unable to participate in goal setting ADL Goals Pt Will Perform Grooming: (P) with set-up;sitting Pt Will Perform Upper Body Dressing: (P) with set-up;sitting Pt Will Perform Lower Body Dressing: (P) sit to/from stand;sitting/lateral leans;with min guard assist Pt Will Transfer to Toilet: (P) with min assist;bedside commode Additional ADL Goal #1: (P) Pt will increase to x10 mins OOB ADL or EOB ADL with sustained attention for 2 mins at a time.  OT Frequency: Min 2X/week   Barriers to D/C:            Co-evaluation              AM-PAC OT "6 Clicks" Daily Activity     Outcome Measure Help from another person eating meals?: None Help from another person taking care of personal grooming?: A Little Help from another  person toileting, which includes using toliet, bedpan, or urinal?: Total Help from another person bathing (including washing, rinsing, drying)?: A Lot Help from another person to put on and taking off regular upper body clothing?: A Lot Help from another person to put on and taking off regular lower body clothing?: A Lot 6 Click Score: 14   End of Session Equipment Utilized During Treatment: Gait belt;Rolling walker Nurse Communication: Mobility status  Activity Tolerance: Patient tolerated treatment well Patient left: in chair;with bed alarm set;with  call bell/phone within reach  OT Visit Diagnosis: Unsteadiness on feet (R26.81);Muscle weakness (generalized) (M62.81)                Time: 6010-9323 OT Time Calculation (min): 28 min Charges:  OT General Charges $OT Visit: 1 Visit OT Treatments $Self Care/Home Management : 8-22 mins  Cristi Loron) Glendell Docker OTR/L Acute Rehabilitation Services Pager: [phone] Office: [phone]   Joao Mccurdy J Danyla Wattley 04/09/2019, 10:01 AM

## 2019-04-10 LAB — PHOSPHORUS: Phosphorus: 3.2 mg/dL (ref 2.5–4.6)

## 2019-04-10 LAB — CBC WITH DIFFERENTIAL/PLATELET
Abs Immature Granulocytes: 0.07 10*3/uL (ref 0.00–0.07)
Basophils Absolute: 0.1 10*3/uL (ref 0.0–0.1)
Basophils Relative: 1 %
Eosinophils Absolute: 0.3 10*3/uL (ref 0.0–0.5)
Eosinophils Relative: 4 %
HCT: 38 % — ABNORMAL LOW (ref 39.0–52.0)
Hemoglobin: 11.8 g/dL — ABNORMAL LOW (ref 13.0–17.0)
Immature Granulocytes: 1 %
Lymphocytes Relative: 18 %
Lymphs Abs: 1.3 10*3/uL (ref 0.7–4.0)
MCH: 28 pg (ref 26.0–34.0)
MCHC: 31.1 g/dL (ref 30.0–36.0)
MCV: 90 fL (ref 80.0–100.0)
Monocytes Absolute: 0.7 10*3/uL (ref 0.1–1.0)
Monocytes Relative: 10 %
Neutro Abs: 4.8 10*3/uL (ref 1.7–7.7)
Neutrophils Relative %: 66 %
Platelets: 262 10*3/uL (ref 150–400)
RBC: 4.22 MIL/uL (ref 4.22–5.81)
RDW: 14 % (ref 11.5–15.5)
WBC: 7.2 10*3/uL (ref 4.0–10.5)
nRBC: 0 % (ref 0.0–0.2)

## 2019-04-10 LAB — BASIC METABOLIC PANEL
Anion gap: 14 (ref 5–15)
BUN: 31 mg/dL — ABNORMAL HIGH (ref 8–23)
CO2: 23 mmol/L (ref 22–32)
Calcium: 9.4 mg/dL (ref 8.9–10.3)
Chloride: 103 mmol/L (ref 98–111)
Creatinine, Ser: 0.99 mg/dL (ref 0.61–1.24)
GFR calc Af Amer: >60 mL/min (ref >60)
GFR calc non Af Amer: >60 mL/min (ref >60)
Glucose, Bld: 96 mg/dL (ref 70–99)
Potassium: 4.6 mmol/L (ref 3.5–5.1)
Sodium: 140 mmol/L (ref 135–145)

## 2019-04-10 LAB — MAGNESIUM: Magnesium: 1.9 mg/dL (ref 1.7–2.4)

## 2019-04-10 MED ORDER — CLOPIDOGREL BISULFATE 75 MG PO TABS
75.0000 mg | ORAL_TABLET | Freq: Every day | ORAL | Status: DC
  Administered 2019-04-10 – 2019-04-15 (×6): 75 mg via ORAL
  Filled 2019-04-10 (×6): qty 1

## 2019-04-10 NOTE — Progress Notes (Addendum)
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  Apparently, patient's daughter went to visit their parents, wife did not want patient to come to the hospital, she found him speaking minimally and more slurred.  In the emergency room, he was found with hypernatremia, sodium of 152, dehydration and MRI brain showed possible infarct in the left posterior external capsule.   Assessment & Plan:   Active Problems:   Hypernatremia   Altered mental status   History of CVA (cerebrovascular accident)   Hypercalcemia   CVA (cerebral vascular accident) (HCC)  Acute ischemic stroke: With history of stroke and right hemiparesis.  Acute metabolic encephalopathy due to stroke and electrolyte abnormalities. Clinical findings, more confusion, recurrent fall and dysarthria and suspected right hemiparesis. CT head findings, negative. MRI of the brain, probable small acute infarct posterior external capsule.  Previous advanced ischemic changes.  Chronic hemorrhage right cerebral infarct present on MRI from 2005 with acute cerebral hemorrhage. Carotid Doppler, no significant stenosis 2D echocardiogram, 45 to 50% ejection fraction. Antiplatelet therapy, not on any treatment at home.  Currently on aspirin and aspirin for 3 weeks and then aspirin alone as per neurology recommendation. LDL 59, no indication for statin. Hemoglobin A1c, 5.7.  No indication for treatment. DVT prophylaxis, SCDs. Therapy recommendations, skilled nursing facility.  Hypernatremia: Due to dehydration.  Free water deficit.  Treated with dextrose infusion.  Improved and normalized.    Hypercalcemia: Due to dehydration.  Improved and normalized.   Recheck in the morning to ensure stabilization.  Normalized.  Abnormal urinalysis: Insignificant growth.  No indication for antibiotics.  Multiple skin tears present on admission: Skin tear at buttock, skin tear left knee, skin tear left medial side of the toe.  Local wound care.  Discussed and updated patient's daughter Ms Tresa Endo, according to her he has right sided weakness for long time and walking with walker until 2 weeks and has not been in poor health.   DVT prophylaxis: SCDs Code Status: Full code Family Communication: daughter , Ms Tresa Endo  Disposition Plan: SNF when available.   Consultants:   Neurology  Procedures:   None  Antimicrobials:   None   Subjective: Patient seen and examined.  Patient is more awake.  He is following commands.  He has depressed voice, however he tells me that he lives with his wife.  Wife takes care of him.  Denied any pain.  He says he walks with a walker and with a wheelchair. Objective: Vitals:   04/09/19 2348 04/10/19 0353 04/10/19 0500 04/10/19 0917  BP: (!) 133/92 (!) 153/85  (!) 159/93  Pulse: 100 (!) 102  (!) 102  Resp: 14   16  Temp: 98.6 F (37 C) 98.7 F (37.1 C)  97.7 F (36.5 C)  TempSrc: Oral Oral  Axillary  SpO2: 98% 98%  100%  Weight:   52.9 kg     Intake/Output Summary (Last 24 hours) at 04/10/2019 1111 Last data filed at 04/09/2019 1733 Gross per 24 hour  Intake --  Output 200 ml  Net -200 ml   Filed Weights   04/08/19 0016 04/08/19 0017 04/10/19 0500  Weight: 49.9 kg 49.7 kg 52.9 kg    Examination:  General exam: Appears calm and comfortable, chronically sick looking.  Dysphonic and dysarthric.  Cachectic. Respiratory system: Clear to auscultation. Respiratory effort normal. Cardiovascular system: S1 & S2 heard, RRR. No JVD, murmurs, rubs, gallops or clicks. No pedal edema. Gastrointestinal system: Abdomen is nondistended, soft and nontender. No organomegaly or masses felt. Normal bowel sounds  heard. Central nervous system: Alert and awake.  Mostly oriented. Mild right facial droop. Both upper extremities, 4/ 5.  He is able to move both hands today, able to clap with some uncoordination. Both lower extremities 3/5.  Poorly moving. Skin: No rashes, lesions or ulcers, multiple skin tears. Psychiatry: Judgement and insight appear normal. Mood & affect flat.    Data Reviewed: I have personally reviewed following labs and imaging studies  CBC: Recent Labs  Lab 04/07/19 2214 04/08/19 0500 04/09/19 0316 04/10/19 0550  WBC 11.5* 9.8 9.1 7.2  NEUTROABS 9.3*  --   --  4.8  HGB 12.6* 11.7* 12.2* 11.8*  HCT 41.4 38.2* 38.7* 38.0*  MCV 91.4 92.7 89.4 90.0  PLT 238 260 298 262   Basic Metabolic Panel: Recent Labs  Lab 04/07/19 2214 04/08/19 0500 04/09/19 0316 04/10/19 0550  NA 152* 144 144 140  K 4.1 3.9 3.6 4.6  CL 113* 104 104 103  CO2 27 28 27 23   GLUCOSE 100* 160* 102* 96  BUN 53* 48* 30* 31*  CREATININE 1.17 1.07 1.02 0.99  CALCIUM 10.5* 9.8 9.7 9.4  MG  --   --   --  1.9  PHOS  --  3.4  --  3.2   GFR: CrCl cannot be calculated (Unknown ideal weight.). Liver Function Tests: Recent Labs  Lab 04/07/19 2214 04/08/19 0500 04/09/19 0316  AST 67*  --  50*  ALT 62*  --  50*  ALKPHOS 66  --  59  BILITOT 0.9  --  0.5  PROT 8.1  --  7.3  ALBUMIN 3.5 3.1* 2.8*   No results for input(s): LIPASE, AMYLASE in the last 168 hours. No results for input(s): AMMONIA in the last 168 hours. Coagulation Profile: No results for input(s): INR, PROTIME in the last 168 hours. Cardiac Enzymes: No results for input(s): CKTOTAL, CKMB, CKMBINDEX, TROPONINI in the last 168 hours. BNP (last 3 results) No results for input(s): PROBNP in the last 8760 hours. HbA1C: Recent Labs    04/09/19 0316  HGBA1C 5.7*   CBG: Recent Labs  Lab 04/08/19 1716  GLUCAP 102*   Lipid Profile: Recent Labs    04/09/19 0316  CHOL 124  HDL 36*  LDLCALC 59  TRIG 549  CHOLHDL 3.4    Thyroid Function Tests: No results for input(s): TSH, T4TOTAL, FREET4, T3FREE, THYROIDAB in the last 72 hours. Anemia Panel: No results for input(s): VITAMINB12, FOLATE, FERRITIN, TIBC, IRON, RETICCTPCT in the last 72 hours. Sepsis Labs: No results for input(s): PROCALCITON, LATICACIDVEN in the last 168 hours.  Recent Results (from the past 240 hour(s))  SARS Coronavirus 2 [facility] order, Performed in [facility] hospital lab) Nasopharyngeal Nasopharyngeal Swab     Status: None   Collection Time: 04/07/19  7:10 PM   Specimen: Nasopharyngeal Swab  Result Value Ref Range Status   SARS Coronavirus 2 NEGATIVE NEGATIVE Final    Comment: (NOTE) If result is NEGATIVE SARS-CoV-2 target nucleic acids are NOT DETECTED. The SARS-CoV-2 RNA is generally detectable in upper and lower  respiratory specimens during the acute phase of infection. The lowest  concentration of SARS-CoV-2 viral copies this assay can detect is 250  copies / mL. A negative  result does not preclude SARS-CoV-2 infection  and should not be used as the sole basis for treatment or other  patient management decisions.  A negative result may occur with  improper specimen collection / handling, submission of specimen other  than nasopharyngeal swab, presence of viral mutation(s) within the  areas targeted by this assay, and inadequate number of viral copies  (<250 copies / mL). A negative result must be combined with clinical  observations, patient history, and epidemiological information. If result is POSITIVE SARS-CoV-2 target nucleic acids are DETECTED. The SARS-CoV-2 RNA is generally detectable in upper and lower  respiratory specimens dur ing the acute phase of infection.  Positive  results are indicative of active infection with SARS-CoV-2.  Clinical  correlation with patient history and other diagnostic information is  necessary to determine patient infection status.  Positive results do  not rule out bacterial  infection or co-infection with other viruses. If result is PRESUMPTIVE POSTIVE SARS-CoV-2 nucleic acids MAY BE PRESENT.   A presumptive positive result was obtained on the submitted specimen  and confirmed on repeat testing.  While 2019 novel coronavirus  (SARS-CoV-2) nucleic acids may be present in the submitted sample  additional confirmatory testing may be necessary for epidemiological  and / or clinical management purposes  to differentiate between  SARS-CoV-2 and other Sarbecovirus currently known to infect humans.  If clinically indicated additional testing with an alternate test  methodology [phone]([id number]) is advised. The SARS-CoV-2 RNA is generally  detectable in upper and lower respiratory sp ecimens during the acute  phase of infection. The expected result is Negative. Fact Sheet for Patients:  BoilerBrush.com.cyhttps://www.fda.gov/media/136312/download Fact Sheet for Healthcare Providers: https://pope.com/https://www.fda.gov/media/136313/download This test is not yet approved or cleared by the [place] FDA and has been authorized for detection and/or diagnosis of SARS-CoV-2 by FDA under an Emergency Use Authorization (EUA).  This EUA will remain in effect (meaning this test can be used) for the duration of the COVID-19 declaration under Section 564(b)(1) of the Act, 21 U.S.C. section 360bbb-3(b)(1), unless the authorization is terminated or revoked sooner. Performed at [facility], [address] [place], [place] [address]   Culture, Urine     Status: Abnormal   Collection Time: 04/08/19  2:27 AM   Specimen: Urine, Clean Catch  Result Value Ref Range Status   Specimen Description   Final    URINE, CLEAN CATCH Performed at [facility], [address] [place], [place] [address]    Special Requests   Final    NONE Performed at [facility], [address] [place], [place] [address]    Culture (A)  Final    30,000 COLONIES/mL MULTIPLE SPECIES  PRESENT, SUGGEST RECOLLECTION   Report Status 04/09/2019 FINAL  Final         Radiology Studies: Vas [place] Carotid (at [facility])  Result Date: 04/09/2019 Carotid Arterial Duplex Study Indications:       CVA. Risk Factors:      Hypertension, hyperlipidemia. Comparison Study:  No prior studies. Performing Technologist: Gertie FeyMichelle Simonetti MHA, RDMS, RVT, RDCS  Examination Guidelines: A complete evaluation includes B-mode imaging, spectral Doppler, color Doppler, and power Doppler as needed of all accessible portions of each vessel. Bilateral testing is considered an integral part of a complete examination. Limited examinations for reoccurring indications may be performed as noted.  Right Carotid Findings: +----------+--------+--------+--------+---------------------+------------------+             PSV cm/s EDV cm/s  Stenosis Plaque Description    Comments            +----------+--------+--------+--------+---------------------+------------------+  CCA Prox   79       20                                      intimal thickening  +----------+--------+--------+--------+---------------------+------------------+  CCA Distal 90       20                homogeneous and                                                                  smooth                                    +----------+--------+--------+--------+---------------------+------------------+  ICA Prox   42       15                smooth, heterogenous                                                             and calcific                              +----------+--------+--------+--------+---------------------+------------------+  ICA Distal 65       21                smooth and                                                                       homogeneous                               +----------+--------+--------+--------+---------------------+------------------+  ECA        86       9                 heterogenous, focal                                                               and irregular                             +----------+--------+--------+--------+---------------------+------------------+ +----------+--------+-------+----------------+-------------------+             PSV cm/s EDV cms  Describe         Arm Pressure (mmHG)  +----------+--------+-------+----------------+-------------------+  Subclavian 72               Multiphasic, WNL                      +----------+--------+-------+----------------+-------------------+ +---------+--------+--+--------+-+---------+  Vertebral PSV cm/s 25 EDV cm/s 6 Antegrade  +---------+--------+--+--------+-+---------+  Left Carotid Findings: +----------+--------+--------+--------+---------------------+------------------+             PSV cm/s EDV cm/s Stenosis Plaque Description    Comments            +----------+--------+--------+--------+---------------------+------------------+  CCA Prox   104      19                                      intimal thickening  +----------+--------+--------+--------+---------------------+------------------+  CCA Distal 49       11                smooth and                                                                       heterogenous                              +----------+--------+--------+--------+---------------------+------------------+  ICA Prox   35       9                 irregular,                                                                       heterogenous and                                                                 calcific                                  +----------+--------+--------+--------+---------------------+------------------+  ICA Distal 42       11                                                          +----------+--------+--------+--------+---------------------+------------------+  ECA        45                         smooth and  heterogenous                               +----------+--------+--------+--------+---------------------+------------------+ +----------+--------+--------+----------------+-------------------+             PSV cm/s EDV cm/s Describe         Arm Pressure (mmHG)  +----------+--------+--------+----------------+-------------------+  Subclavian 75                Multiphasic, WNL                      +----------+--------+--------+----------------+-------------------+ +---------+--------+--+--------+--+---------+  Vertebral PSV cm/s 38 EDV cm/s 11 Antegrade  +---------+--------+--+--------+--+---------+  Summary: Right Carotid: Velocities in the right ICA are consistent with a 1-39% stenosis. Left Carotid: Velocities in the left ICA are consistent with a 1-39% stenosis. Vertebrals:  Bilateral vertebral arteries demonstrate antegrade flow. Subclavians: Normal flow hemodynamics were seen in bilateral subclavian              arteries. *See table(s) above for measurements and observations.  Electronically signed by Antony Contras MD on 04/09/2019 at 2:22:22 PM.    Final    Vas [place] Transcranial Doppler  Result Date: 04/09/2019  Transcranial Doppler Indications: Stroke. History: Hypertension, hyperlipidemia. Limitations: Poor acoustic windows Limitations for diagnostic windows: Unable to insonate left transtemporal window. Unable to insonate occipital window. Comparison Study: No prior study. Performing Technologist: Maudry Mayhew MHA, RDMS, RVT, RDCS  Examination Guidelines: A complete evaluation includes B-mode imaging, spectral Doppler, color Doppler, and power Doppler as needed of all accessible portions of each vessel. Bilateral testing is considered an integral part of a complete examination. Limited examinations for reoccurring indications may be performed as noted.  +---------+-------------+----------+-----------+-------+  RIGHT TCD Right VM (cm) Depth (cm) Pulsatility Comment  +---------+-------------+----------+-----------+-------+  MCA           51.00                    1.16              +---------+-------------+----------+-----------+-------+  ACA          -45.00                   0.91              +---------+-------------+----------+-----------+-------+  Term ICA      38.00                   1.17              +---------+-------------+----------+-----------+-------+  PCA           19.00                   0.85              +---------+-------------+----------+-----------+-------+  Opthalmic     14.00                   1.38              +---------+-------------+----------+-----------+-------+  +---------+------------+----------+-----------+-------+  LEFT TCD  Left VM (cm) Depth (cm) Pulsatility Comment  +---------+------------+----------+-----------+-------+  Opthalmic    15.00                   1.55              +---------+------------+----------+-----------+-------+  Summary:  Absent left temporal and occipital windows greatly limits study. Normal mean flow velocities  in right middle,anterior ,posterior cerebrals, and bilateral opthalmic arteries. *See table(s) above for measurements and observations.  Diagnosing physician: Delia Heady MD Electronically signed by Delia Heady MD on 04/09/2019 at 2:24:02 PM.    Final         Scheduled Meds:  aspirin EC  81 mg Oral Daily   clopidogrel  75 mg Oral Daily   Continuous Infusions:   LOS: 2 days    Time spent: 30 minutes    Dorcas Carrow, MD [facility] Pager [phone]  If 7PM-7AM, please contact night-coverage [email or web address] Password [email or web address] 04/10/2019, 11:11 AM

## 2019-04-11 NOTE — Progress Notes (Addendum)
  Speech Language Pathology Treatment: Dysphagia  Patient Details Name: Raymond Knight MRN: [id number] DOB: 1946-05-18 Today's Date: 04/11/2019 Time: 0920-0940 SLP Time Calculation (min) (ACUTE ONLY): 20 min  Assessment / Plan / Recommendation Clinical Impression  Pt continues to demonstrated immediate coughing after sips of water, even when bolus size controlled by SLP. Pt able to take straw sips of nectar thick liquids without any immediate coughing. Oral dysphagia continues with prolonged oral manipulation, pt required liquid wash to transit puree residue. Recommend MBS for objective assessment of swallowing prior to d/c. Suspect pt may need prolonged use of nectar thick liquids to reduce occurrences of aspiration; unsure what therapeutic interventions are needed without instrumental assessment. Will attempt MBS today or tomorrow.   HPI HPI: Pt is a 73 yo male s/p MRI revealing possible L posterior external capsule. PMHx: HTN, HLD, CVA R residual weakness, ORIF L hip.      SLP Plan  Continue with current plan of care       Recommendations  Diet recommendations: Dysphagia 1 (puree);Nectar-thick liquid Liquids provided via: Cup;Straw Medication Administration: Crushed with puree Supervision: Full supervision/cueing for compensatory strategies;Staff to assist with self feeding Compensations: Follow solids with liquid                Follow up Recommendations: Skilled Nursing facility SLP Visit Diagnosis: Dysphagia, oropharyngeal phase (R13.12);Dysarthria and anarthria (R47.1);Aphonia (R49.1);Cognitive communication deficit (O29.476) Plan: Continue with current plan of care       GO               Raymond [place], MA [facility] Pager [phone] Office [phone]  Raymond Knight 04/11/2019, 10:09 AM

## 2019-04-11 NOTE — Progress Notes (Signed)
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  Apparently, patient's daughter went to visit their parents, wife did not want patient to come to the hospital, she found him speaking minimally and more slurred.  In the emergency room, he was found with hypernatremia, sodium of 152, dehydration and MRI brain showed possible infarct in the left posterior external capsule.   Assessment & Plan:   Active Problems:   Hypernatremia   Altered mental status   History of CVA (cerebrovascular accident)   Hypercalcemia   CVA (cerebral vascular accident) (HCC)  Acute ischemic stroke: With history of stroke and right hemiparesis.  Acute metabolic encephalopathy due to stroke and electrolyte abnormalities. Clinical findings, more confusion, recurrent fall and dysarthria and suspected right hemiparesis. CT head findings, negative. MRI of the brain, probable small acute infarct posterior external capsule.  Previous advanced ischemic changes.  Chronic hemorrhage right cerebral infarct present on MRI from 2005 with acute cerebral hemorrhage. Carotid Doppler, no significant stenosis 2D echocardiogram, 45 to 50% ejection fraction. Antiplatelet therapy, not on any treatment at home.  Currently on aspirin and Plavix for 3 weeks and then aspirin alone as per neurology recommendation. LDL 59, no indication for statin. Hemoglobin A1c, 5.7.  No indication for treatment. DVT prophylaxis, SCDs. Therapy recommendations, skilled nursing facility.  Hypernatremia: Due to dehydration.  Free water deficit.  Treated with dextrose infusion.  Improved and normalized.    Hypercalcemia: Due to dehydration.  Improved and normalized.   Recheck in the morning to ensure stabilization.  Normalized.  Abnormal urinalysis: Insignificant growth.  No indication for antibiotics.  Multiple skin tears present on admission: Skin tear at buttock, skin tear left knee, skin tear left medial side of the toe.  Local wound care.    DVT prophylaxis: SCDs Code Status: Full code Family Communication: daughter , Ms Tresa Endo  Disposition Plan: SNF when available.  Medically stable to transfer.   Consultants:   Neurology  Procedures:   None  Antimicrobials:   None   Subjective: Patient seen and examined.  Patient is more awake and he was able to communicate.  He said he is legally married to his wife.  He said she takes care of him.  He becomes upset when suggested that his wife can visit him in the hospital.  Objective: Vitals:   04/11/19 0037 04/11/19 0401 04/11/19 0500 04/11/19 0907  BP: (!) 129/91 117/84  124/70  Pulse: (!) 115 (!) 108  97  Resp:  18  16  Temp: 98.1 F (36.7 C) 98.1 F (36.7 C)  98 F (36.7 C)  TempSrc: Oral   Axillary  SpO2: 100% 95%  100%  Weight:   51.8 kg     Intake/Output Summary (Last 24 hours) at 04/11/2019 1116 Last data filed at 04/11/2019 0040 Gross per 24 hour  Intake 120 ml  Output 500 ml  Net -380 ml   Filed Weights   04/08/19 0017 04/10/19 0500 04/11/19 0500  Weight: 49.7 kg 52.9 kg 51.8 kg    Examination:  General exam: Appears calm and comfortable, chronically sick looking.  Dysphonic and dysarthric intermittently. Cachectic. Respiratory system: Clear to auscultation. Respiratory effort normal. Cardiovascular system: S1 & S2 heard, RRR. No JVD, murmurs, rubs, gallops or clicks. No pedal  edema. Gastrointestinal system: Abdomen is nondistended, soft and nontender. No organomegaly or masses felt. Normal bowel sounds heard. Central nervous system: Alert and awake.  Mostly oriented. Mild right facial droop. Both upper extremities, 4/ 5.  He is able to move both hands today, able to  clap with some uncoordination. Both lower extremities 3/5.  Poorly moving. Skin: No rashes, lesions or ulcers, multiple skin tears. Psychiatry: Judgement and insight appear normal. Mood & affect flat.    Data Reviewed: I have personally reviewed following labs and imaging studies  CBC: Recent Labs  Lab 04/07/19 2214 04/08/19 0500 04/09/19 0316 04/10/19 0550  WBC 11.5* 9.8 9.1 7.2  NEUTROABS 9.3*  --   --  4.8  HGB 12.6* 11.7* 12.2* 11.8*  HCT 41.4 38.2* 38.7* 38.0*  MCV 91.4 92.7 89.4 90.0  PLT 238 260 298 262   Basic Metabolic Panel: Recent Labs  Lab 04/07/19 2214 04/08/19 0500 04/09/19 0316 04/10/19 0550  NA 152* 144 144 140  K 4.1 3.9 3.6 4.6  CL 113* 104 104 103  CO2 27 28 27 23   GLUCOSE 100* 160* 102* 96  BUN 53* 48* 30* 31*  CREATININE 1.17 1.07 1.02 0.99  CALCIUM 10.5* 9.8 9.7 9.4  MG  --   --   --  1.9  PHOS  --  3.4  --  3.2   GFR: CrCl cannot be calculated (Unknown ideal weight.). Liver Function Tests: Recent Labs  Lab 04/07/19 2214 04/08/19 0500 04/09/19 0316  AST 67*  --  50*  ALT 62*  --  50*  ALKPHOS 66  --  59  BILITOT 0.9  --  0.5  PROT 8.1  --  7.3  ALBUMIN 3.5 3.1* 2.8*   No results for input(s): LIPASE, AMYLASE in the last 168 hours. No results for input(s): AMMONIA in the last 168 hours. Coagulation Profile: No results for input(s): INR, PROTIME in the last 168 hours. Cardiac Enzymes: No results for input(s): CKTOTAL, CKMB, CKMBINDEX, TROPONINI in the last 168 hours. BNP (last 3 results) No results for input(s): PROBNP in the last 8760 hours. HbA1C: Recent Labs    04/09/19 0316  HGBA1C 5.7*   CBG: Recent Labs  Lab 04/08/19 1716  GLUCAP 102*   Lipid Profile: Recent Labs    04/09/19 0316  CHOL 124  HDL 36*  LDLCALC 59  TRIG 629  CHOLHDL 3.4   Thyroid Function Tests: No results for input(s): TSH, T4TOTAL, FREET4, T3FREE, THYROIDAB in the last 72 hours. Anemia Panel: No results for input(s): VITAMINB12, FOLATE,  FERRITIN, TIBC, IRON, RETICCTPCT in the last 72 hours. Sepsis Labs: No results for input(s): PROCALCITON, LATICACIDVEN in the last 168 hours.  Recent Results (from the past 240 hour(s))  SARS Coronavirus 2 [facility] order, Performed in [facility] hospital lab) Nasopharyngeal Nasopharyngeal Swab     Status: None   Collection Time: 04/07/19  7:10 PM   Specimen: Nasopharyngeal Swab  Result Value Ref Range Status   SARS Coronavirus 2 NEGATIVE NEGATIVE Final    Comment: (NOTE) If result is NEGATIVE SARS-CoV-2 target nucleic acids are NOT DETECTED. The SARS-CoV-2 RNA is generally detectable in upper and lower  respiratory specimens during the acute phase of infection. The lowest  concentration of SARS-CoV-2 viral copies this assay can detect is 250  copies / mL. A negative result does not preclude SARS-CoV-2 infection  and should not be used as the sole basis for treatment or other  patient management decisions.  A negative result may  occur with  improper specimen collection / handling, submission of specimen other  than nasopharyngeal swab, presence of viral mutation(s) within the  areas targeted by this assay, and inadequate number of viral copies  (<250 copies / mL). A negative result must be combined with clinical  observations, patient history, and epidemiological information. If result is POSITIVE SARS-CoV-2 target nucleic acids are DETECTED. The SARS-CoV-2 RNA is generally detectable in upper and lower  respiratory specimens dur ing the acute phase of infection.  Positive  results are indicative of active infection with SARS-CoV-2.  Clinical  correlation with patient history and other diagnostic information is  necessary to determine patient infection status.  Positive results do  not rule out bacterial infection or co-infection with other viruses. If result is PRESUMPTIVE POSTIVE SARS-CoV-2 nucleic acids MAY BE PRESENT.   A presumptive positive result was obtained on the  submitted specimen  and confirmed on repeat testing.  While 2019 novel coronavirus  (SARS-CoV-2) nucleic acids may be present in the submitted sample  additional confirmatory testing may be necessary for epidemiological  and / or clinical management purposes  to differentiate between  SARS-CoV-2 and other Sarbecovirus currently known to infect humans.  If clinically indicated additional testing with an alternate test  methodology [phone]) is advised. The SARS-CoV-2 RNA is generally  detectable in upper and lower respiratory sp ecimens during the acute  phase of infection. The expected result is Negative. Fact Sheet for Patients:  BoilerBrush.com.cy Fact Sheet for Healthcare Providers: https://pope.com/ This test is not yet approved or cleared by the [place] FDA and has been authorized for detection and/or diagnosis of SARS-CoV-2 by FDA under an Emergency Use Authorization (EUA).  This EUA will remain in effect (meaning this test can be used) for the duration of the COVID-19 declaration under Section 564(b)(1) of the Act, 21 U.S.C. section 360bbb-3(b)(1), unless the authorization is terminated or revoked sooner. Performed at [facility], [address] [place], [place] [address]   Culture, Urine     Status: Abnormal   Collection Time: 04/08/19  2:27 AM   Specimen: Urine, Clean Catch  Result Value Ref Range Status   Specimen Description   Final    URINE, CLEAN CATCH Performed at [facility], [address], [place], [place] [address]    Special Requests   Final    NONE Performed at [facility], [address] [place], [place] [address]    Culture (A)  Final    30,000 COLONIES/mL MULTIPLE SPECIES PRESENT, SUGGEST RECOLLECTION   Report Status 04/09/2019 FINAL  Final         Radiology Studies: No results found.      Scheduled Meds: . aspirin EC  81 mg  Oral Daily  . clopidogrel  75 mg Oral Daily   Continuous Infusions:   LOS: 3 days    Time spent: 25 minutes    Dorcas Carrow, MD [facility] Pager [phone]  If 7PM-7AM, please contact night-coverage [email or web address] Password [email or web address] 04/11/2019, 11:16 AM

## 2019-04-11 NOTE — Plan of Care (Signed)
  Problem: Education: Goal: Knowledge of secondary prevention will improve Outcome: Progressing   Problem: Education: Goal: Knowledge of disease or condition will improve Outcome: Progressing   

## 2019-04-12 NOTE — Care Management Important Message (Signed)
Important Message  Patient Details  Name: Raymond Knight MRN: [id number] Date of Birth: [birth year]   Medicare Important Message Given:  Yes     Orbie Pyo 04/12/2019, 3:29 PM

## 2019-04-12 NOTE — Progress Notes (Signed)
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  Apparently, patient's daughter went to visit their parents, wife did not want patient to come to the hospital, she found him speaking minimally and more slurred.  In the emergency room, he was found with hypernatremia, sodium of 152, dehydration and MRI brain showed possible infarct in the left posterior external capsule.   Assessment & Plan:   Active Problems:   Hypernatremia   Altered mental status   History of CVA (cerebrovascular accident)   Hypercalcemia   CVA (cerebral vascular accident) (HCC)  Acute ischemic stroke: With history of stroke and right hemiparesis.  Acute metabolic encephalopathy due to stroke and electrolyte abnormalities. Clinical findings, more confusion, recurrent fall and dysarthria and suspected right hemiparesis. CT head findings, negative. MRI of the brain, probable small acute infarct posterior external capsule.  Previous advanced ischemic changes.  Chronic hemorrhage right cerebral infarct present on MRI from 2005 with acute cerebral hemorrhage. Carotid Doppler, no significant stenosis 2D echocardiogram, 45 to 50% ejection fraction. Antiplatelet therapy, not on any treatment at home.  Currently on aspirin and Plavix for 3 weeks and then aspirin alone as per neurology recommendation. LDL 59, no indication for statin. Hemoglobin A1c, 5.7.  No indication for treatment. DVT prophylaxis, SCDs. Therapy recommendations, skilled nursing facility.  Hypernatremia: Due to dehydration and free water deficit.  Treated with dextrose infusion.  Now adequate oral intake.  Improved.    Hypercalcemia: Due to dehydration. Improved and  normalized.    Abnormal urinalysis: Insignificant growth.  No indication for antibiotics.  Multiple skin tears present on admission: Skin tear at buttock, skin tear left knee, skin tear left medial side of the toe.  Local wound care.  Patient is medically stabilizing.  He can be transferred to skilled nursing facility if available.  DVT prophylaxis: SCDs Code Status: Full code Family Communication: daughter , Ms Tresa Endo .  Disposition Plan: SNF when available.  Medically stable to transfer.   Consultants:   Neurology  Procedures:   None  Antimicrobials:   None   Subjective: Patient seen and examined.  No overnight events.  He has some difficulty communicating due to dysphonia, however he is alert oriented.  Denies any complaints today. Objective: Vitals:   04/12/19 0015 04/12/19 0424 04/12/19 0500 04/12/19 1022  BP: (!) 153/73 (!) 152/78  124/67  Pulse: 97 95  (!) 46  Resp: 17 17  12   Temp: 98.4 F (36.9 C) 98.2 F (36.8 C)  98 F (36.7 C)  TempSrc: Oral Oral  Oral  SpO2: 97% 99%  100%  Weight:   48.3 kg     Intake/Output Summary (Last 24 hours) at 04/12/2019 1037 Last data filed at 04/12/2019 0900 Gross per 24 hour  Intake 921 ml  Output 250 ml  Net 671 ml   Filed Weights   04/10/19 0500 04/11/19 0500 04/12/19 0500  Weight: 52.9 kg 51.8 kg 48.3 kg    Examination:  General exam: Appears calm and comfortable, chronically sick looking.  Dysphonic and dysarthric intermittently. Cachectic. Respiratory system: Clear to auscultation. Respiratory effort normal. Cardiovascular system: S1 & S2 heard, RRR. No JVD, murmurs, rubs, gallops or clicks. No pedal edema. Gastrointestinal system: Abdomen is nondistended, soft and nontender. No organomegaly or masses  felt. Normal bowel sounds heard. Central nervous system: Alert and awake.  Mostly oriented. Mild right facial droop.  Dysphonia. Both upper extremities, 4/ 5.  He is able to move both hands, able to clap with some  uncoordination. Both lower extremities 3/5.  Poorly moving.  Right leg is possibly more weaker than left. Skin: No rashes, lesions or ulcers, multiple skin tears. Psychiatry: Judgement and insight appear normal. Mood & affect flat.    Data Reviewed: I have personally reviewed following labs and imaging studies  CBC: Recent Labs  Lab 04/07/19 2214 04/08/19 0500 04/09/19 0316 04/10/19 0550  WBC 11.5* 9.8 9.1 7.2  NEUTROABS 9.3*  --   --  4.8  HGB 12.6* 11.7* 12.2* 11.8*  HCT 41.4 38.2* 38.7* 38.0*  MCV 91.4 92.7 89.4 90.0  PLT 238 260 298 262   Basic Metabolic Panel: Recent Labs  Lab 04/07/19 2214 04/08/19 0500 04/09/19 0316 04/10/19 0550  NA 152* 144 144 140  K 4.1 3.9 3.6 4.6  CL 113* 104 104 103  CO2 27 28 27 23   GLUCOSE 100* 160* 102* 96  BUN 53* 48* 30* 31*  CREATININE 1.17 1.07 1.02 0.99  CALCIUM 10.5* 9.8 9.7 9.4  MG  --   --   --  1.9  PHOS  --  3.4  --  3.2   GFR: CrCl cannot be calculated (Unknown ideal weight.). Liver Function Tests: Recent Labs  Lab 04/07/19 2214 04/08/19 0500 04/09/19 0316  AST 67*  --  50*  ALT 62*  --  50*  ALKPHOS 66  --  59  BILITOT 0.9  --  0.5  PROT 8.1  --  7.3  ALBUMIN 3.5 3.1* 2.8*   No results for input(s): LIPASE, AMYLASE in the last 168 hours. No results for input(s): AMMONIA in the last 168 hours. Coagulation Profile: No results for input(s): INR, PROTIME in the last 168 hours. Cardiac Enzymes: No results for input(s): CKTOTAL, CKMB, CKMBINDEX, TROPONINI in the last 168 hours. BNP (last 3 results) No results for input(s): PROBNP in the last 8760 hours. HbA1C: No results for input(s): HGBA1C in the last 72 hours. CBG: Recent Labs  Lab 04/08/19 1716  GLUCAP 102*   Lipid Profile: No results for input(s): CHOL, HDL, LDLCALC, TRIG, CHOLHDL, LDLDIRECT in the last 72 hours. Thyroid Function Tests: No results for input(s): TSH, T4TOTAL, FREET4, T3FREE, THYROIDAB in the last 72 hours. Anemia Panel: No results  for input(s): VITAMINB12, FOLATE, FERRITIN, TIBC, IRON, RETICCTPCT in the last 72 hours. Sepsis Labs: No results for input(s): PROCALCITON, LATICACIDVEN in the last 168 hours.  Recent Results (from the past 240 hour(s))  SARS Coronavirus 2 [facility](Hospital order, Performed [facility] lab) Nasopharyngeal Nasopharyngeal Swab     Status: None   Collection Time: 04/07/19  7:10 PM   Specimen: Nasopharyngeal Swab  Result Value Ref Range Status   SARS Coronavirus 2 NEGATIVE NEGATIVE Final    Comment: (NOTE) If result is NEGATIVE SARS-CoV-2 target nucleic acids are NOT DETECTED. The SARS-CoV-2 RNA is generally detectable in upper and lower  respiratory specimens during the acute phase of infection. The lowest  concentration of SARS-CoV-2 viral copies this assay can detect is 250  copies / mL. A negative result does not preclude SARS-CoV-2 infection  and should not be used as the sole basis for treatment or other  patient management decisions.  A negative result may occur with  improper specimen collection / handling, submission of specimen other  than  nasopharyngeal swab, presence of viral mutation(s) within the  areas targeted by this assay, and inadequate number of viral copies  (<250 copies / mL). A negative result must be combined with clinical  observations, patient history, and epidemiological information. If result is POSITIVE SARS-CoV-2 target nucleic acids are DETECTED. The SARS-CoV-2 RNA is generally detectable in upper and lower  respiratory specimens dur ing the acute phase of infection.  Positive  results are indicative of active infection with SARS-CoV-2.  Clinical  correlation with patient history and other diagnostic information is  necessary to determine patient infection status.  Positive results do  not rule out bacterial infection or co-infection with other viruses. If result is PRESUMPTIVE POSTIVE SARS-CoV-2 nucleic acids MAY BE PRESENT.   A presumptive positive  result was obtained on the submitted specimen  and confirmed on repeat testing.  While 2019 novel coronavirus  (SARS-CoV-2) nucleic acids may be present in the submitted sample  additional confirmatory testing may be necessary for epidemiological  and / or clinical management purposes  to differentiate between  SARS-CoV-2 and other Sarbecovirus currently known to infect humans.  If clinically indicated additional testing with an alternate test  methodology [phone]) is advised. The SARS-CoV-2 RNA is generally  detectable in upper and lower respiratory sp ecimens during the acute  phase of infection. The expected result is Negative. Fact Sheet for Patients:  StrictlyIdeas.no Fact Sheet for Healthcare Providers: BankingDealers.co.za This test is not yet approved or cleared by the [place] FDA and has been authorized for detection and/or diagnosis of SARS-CoV-2 by FDA under an Emergency Use Authorization (EUA).  This EUA will remain in effect (meaning this test can be used) for the duration of the COVID-19 declaration under Section 564(b)(1) of the Act, 21 U.S.C. section 360bbb-3(b)(1), unless the authorization is terminated or revoked sooner. Performed at [facility], [place] [address], [place], [place] [address]   Culture, Urine     Status: Abnormal   Collection Time: 04/08/19  2:27 AM   Specimen: Urine, Clean Catch  Result Value Ref Range Status   Specimen Description   Final    URINE, CLEAN CATCH Performed at [facility], [place] [address], [place], [place] [address]    Special Requests   Final    NONE Performed at [facility], [place] [address], [place], [place] [address]    Culture (A)  Final    30,000 COLONIES/mL MULTIPLE SPECIES PRESENT, SUGGEST RECOLLECTION   Report Status 04/09/2019 FINAL  Final         Radiology Studies: No results found.      Scheduled  Meds: . aspirin EC  81 mg Oral Daily  . clopidogrel  75 mg Oral Daily   Continuous Infusions:   LOS: 4 days    Time spent: 25 minutes    Barb Merino, MD [facility] Pager [phone]  If 7PM-7AM, please contact night-coverage [email or web address] Password [facility] 04/12/2019, 10:37 AM

## 2019-04-12 NOTE — Plan of Care (Signed)
  Problem: Education: Goal: Knowledge of patient specific risk factors addressed and post discharge goals established will improve 04/12/2019 0701 by Marcos Eke, RN Outcome: Progressing 04/12/2019 0002 by Marcos Eke, RN Outcome: Progressing   Problem: Education: Goal: Knowledge of secondary prevention will improve Outcome: Progressing

## 2019-04-12 NOTE — Progress Notes (Signed)
Speech Language Pathology Treatment: Dysphagia  Patient Details Name: Raymond Knight MRN: [id number] DOB: [birth year] Today's Date: 04/12/2019 Time: 1213-1223 SLP Time Calculation (min) (ACUTE ONLY): 10 min  Assessment / Plan / Recommendation Clinical Impression  Unfortunately, unable to schedule MBS today due to holiday staffing issues.  Pt is currently tolerating dysphagia 1, nectar-thick liquid diet without concerns for aspiration.  Trials of thin liquids continue to elicit consistent and immediate coughing, without awareness or concern by pt.  Liquid wash necessary to transit residue from dysphagia 1 consistency.  Lung sounds are clear.  He is tolerating current diet; will plan for MBS prior to D/C.   HPI HPI: Pt is a 73 yo male s/p MRI revealing possible L posterior external capsule. PMHx: HTN, HLD, CVA R residual weakness, ORIF L hip.      SLP Plan  Continue with current plan of care       Recommendations  Diet recommendations: Dysphagia 1 (puree);Nectar-thick liquid Liquids provided via: Cup;Straw Medication Administration: Crushed with puree Supervision: Full supervision/cueing for compensatory strategies;Staff to assist with self feeding Compensations: Follow solids with liquid                Follow up Recommendations: Skilled Nursing facility SLP Visit Diagnosis: Dysphagia, oropharyngeal phase (R13.12) Plan: Continue with current plan of care       GO                Blenda Mounts Laurice 04/12/2019, 12:26 PM  Ignazio Kincaid L. Samson Frederic, MA CCC/SLP Acute Rehabilitation Services Office number [phone] Pager [phone]

## 2019-04-12 NOTE — Progress Notes (Signed)
Occupational Therapy Treatment Patient Details Name: Raymond Knight MRN: [id number] DOB: 1946/06/25 Today's Date: 04/12/2019    History of present illness 73 yo male admitted with MRI(+) L posterior external capsule. PMH: HTN, HLD, CVA R residual weakness ORIF L hip   OT comments  Pt requires total +2 Total (A) with stedy this session and bed elevated. Pt will require hoyer lift out of bed with RN staff. Pt currently without recliner available. Pt will need SNF level care.    Follow Up Recommendations  SNF;Supervision/Assistance - 24 hour    Equipment Recommendations  Other (comment)    Recommendations for Other Services      Precautions / Restrictions Precautions Precautions: Fall       Mobility Bed Mobility Overal bed mobility: Needs Assistance Bed Mobility: Rolling;Sit to Supine;Supine to Sit Rolling: Max assist   Supine to sit: Max assist Sit to supine: Max assist   General bed mobility comments: pt requires (A) to elevate trunk from surface and (A) for bil LE to place back on bed surface  Transfers Overall transfer level: Needs assistance   Transfers: Sit to/from Stand Sit to Stand: From elevated surface;+2 physical assistance;Total assist         General transfer comment: holding stedy with bil UE and requires pad to power up from surface    Balance Overall balance assessment: Needs assistance Sitting-balance support: Single extremity supported Sitting balance-[name]: Fair       Standing balance-[name]: Zero Standing balance comment: reliant on RW                           ADL either performed or assessed with clinical judgement   ADL Overall ADL's : Needs assistance/impaired Eating/Feeding: Set up;Bed level Eating/Feeding Details (indicate cue type and reason): pt demonstrates deficits with utensil use but able to self feed by drinking items from a cup like grits and apple sauce. Pt will need min (A) for any item that is not  easily poured for intake Grooming: Minimal assistance;Bed level       Lower Body Bathing: Total assistance       Lower Body Dressing: Total assistance   Toilet Transfer: +2 for physical assistance;Total assistance             General ADL Comments: pt demonstrates sitting EOB with min guard (A). pt however with total +2 total (A) for static standing with steady. pt holding onto the stedy with instruction but unable to power up      Vision   Vision Assessment?: Vision impaired- to be further tested in functional context   Perception     Praxis      Cognition Arousal/Alertness: Awake/alert Behavior During Therapy: WFL for tasks assessed/performed Overall Cognitive Status: No family/caregiver present to determine baseline cognitive functioning                                 General Comments: pt states "no" when asked if he was hungry but when food placed in front of patient immediately starts to eat. Pt with 100% intake of the food that he choices from the options available. pt naturally has dislikes and leaving those items on the tray.         Exercises     Shoulder Instructions       General Comments high risk for skin break down due to decrease ability  to reposition and FTT. PT could benefit from air mattress overaly.     Pertinent Vitals/ Pain       Pain Assessment: Faces Faces Pain Scale: Hurts a little bit Pain Location: L knee Pain Descriptors / Indicators: Discomfort Pain Intervention(s): Monitored during session;Repositioned  Home Living                                          Prior Functioning/Environment              Frequency  Min 2X/week        Progress Toward Goals  OT Goals(current goals can now be found in the care plan section)  Progress towards OT goals: Progressing toward goals  Acute Rehab OT Goals Patient Stated Goal: none stated OT Goal Formulation: Patient unable to participate in goal  setting ADL Goals Pt Will Perform Grooming: with set-up;sitting Pt Will Perform Upper Body Dressing: with set-up;sitting Pt Will Perform Lower Body Dressing: sit to/from stand;sitting/lateral leans;with min guard assist Pt Will Transfer to Toilet: with min assist;bedside commode Additional ADL Goal #1: Pt will increase to x10 mins OOB ADL or EOB ADL with sustained attention for 2 mins at a time.  Plan Discharge plan remains appropriate    Co-evaluation    PT/OT/SLP Co-Evaluation/Treatment: Yes Reason for Co-Treatment: Complexity of the patient's impairments (multi-system involvement);Necessary to address cognition/behavior during functional activity;For patient/therapist safety;To address functional/ADL transfers   OT goals addressed during session: ADL's and self-care;Proper use of Adaptive equipment and DME;Strengthening/ROM      AM-PAC OT "6 Clicks" Daily Activity     Outcome Measure   Help from another person eating meals?: A Little Help from another person taking care of personal grooming?: A Little Help from another person toileting, which includes using toliet, bedpan, or urinal?: Total Help from another person bathing (including washing, rinsing, drying)?: A Lot Help from another person to put on and taking off regular upper body clothing?: A Lot Help from another person to put on and taking off regular lower body clothing?: Total 6 Click Score: 12    End of Session Equipment Utilized During Treatment: Gait belt  OT Visit Diagnosis: Unsteadiness on feet (R26.81);Muscle weakness (generalized) (M62.81)   Activity Tolerance Patient tolerated treatment well   Patient Left in bed;with call bell/phone within reach;with bed alarm set;with SCD's reapplied   Nurse Communication Mobility status;Precautions        Time: 9563-8756 OT Time Calculation (min): 32 min  Charges: OT General Charges $OT Visit: 1 Visit OT Treatments $Self Care/Home Management : 8-22  mins   Jeri Modena, OTR/L  Acute Rehabilitation Services Pager: [phone] Office: [phone] .    Jeri Modena 04/12/2019, 8:51 AM

## 2019-04-12 NOTE — TOC Progression Note (Signed)
Transition of Care [facility]) - Progression Note    Patient Details  Name: Raymond Knight MRN: [id number] Date of Birth: 04/06/1946  Transition of Care [facility]) CM/SW [place], [place] Phone Number: 04/12/2019, 10:06 AM  Clinical Narrative:  CSW following for discharge plan. Noting DSS involvement; CSW left a voicemail for [employer] [phone]). Unsure if DSS working today due to holiday, but will await call back. CSW to follow.          Expected Discharge Plan and Services           Expected Discharge Date: (unknown)                                     Social Determinants of Health (SDOH) Interventions    Readmission Risk Interventions No flowsheet data found.

## 2019-04-12 NOTE — Progress Notes (Signed)
Physical Therapy Treatment Note  Pt progressing towards physical therapy goals. Decreased tolerance for functional activity and pt only able to tolerate <3 minutes of OOB mobility at this time. Karleen DolphinUtilized Stedy for sit<>stand attempts and for sitting balance. Pt reaching for center bar of Stedy for support but unable to maintain center or upright, neutral spine. Will continue to follow and progress as able per POC.    04/12/19 0924  PT Visit Information  Last PT Received On 04/12/19  Assistance Needed +2  PT/OT/SLP Co-Evaluation/Treatment Yes  Reason for Co-Treatment Complexity of the patient's impairments (multi-system involvement);Necessary to address cognition/behavior during functional activity;To address functional/ADL transfers;For patient/therapist safety  PT goals addressed during session Mobility/safety with mobility;Balance;Proper use of DME  History of Present Illness 73 yo male admitted with MRI(+) L posterior external capsule. PMH: HTN, HLD, CVA R residual weakness ORIF L hip  Precautions  Precautions Fall  Restrictions  Weight Bearing Restrictions No  Home Living  Family/patient expects to be discharged to: Private residence  Living Arrangements Spouse/significant other  Available Help at Discharge Other (Comment) (unclear)  Type of Home House  Home Access Level entry  Home Layout One level  Bathroom Shower/Tub Tub/shower unit  Bathroom Toilet Handicapped height  Home Equipment Wheelchair - [employer] Corporationmanual;Walker - 2 wheels   Lives With Spouse  Prior Function  Level of Independence Needs assistance  Gait / Transfers Assistance Needed pt reports wife is his main help. pt is not a good historian.  He agreed that a RW was used and a w/c was used to get to other parts of the house.  Pt stated he doesn't leave the house.  ADL's / Homemaking Assistance Needed Pt reports requiring assist with bathing/dressing  Communication / Swallowing Assistance Needed low voice  Communication   Communication Expressive difficulties (slow to answer and low speaking volume)  Pain Assessment  Pain Assessment Faces  Faces Pain Scale 2  Pain Location L knee  Pain Descriptors / Indicators Discomfort  Pain Intervention(s) Monitored during session  Cognition  Arousal/Alertness Awake/alert  Behavior During Therapy WFL for tasks assessed/performed  Overall Cognitive Status No family/caregiver present to determine baseline cognitive functioning  Upper Extremity Assessment  RUE Deficits / Details 2-/5 MM grade, weakness and poor coordination  RUE Coordination decreased fine motor;decreased gross motor  LUE Deficits / Details 3/5 MM grade  Lower Extremity Assessment  RLE Deficits / Details bil mild knee flexion contracture to -30* ext lag Active assist.  grossly 2/5 ext , but assisted in stance for sub maximal stance and squat pivot transfer.  RLE Coordination decreased gross motor  LLE Deficits / Details grossly 3-/5 extension,  incoordinated, but stronger overalll than right LE  Bed Mobility  Overal bed mobility Needs Assistance  Bed Mobility Rolling;Sit to Supine;Supine to Sit  Rolling Max assist  Sidelying to sit Mod assist;+2 for physical assistance  Sit to supine Max assist;+2 for physical assistance  Sit to sidelying Mod assist  General bed mobility comments pt requires (A) to elevate trunk from surface and (A) for bil LE to place back on bed surface  Transfers  Overall transfer level Needs assistance  Transfer via Lift Equipment Stedy  Transfers Sit to/from Stand  Sit to Stand From elevated surface;+2 physical assistance;Total assist  General transfer comment holding stedy with bil UE and requires pad to power up from surface  Ambulation/Gait  General Gait Details unable today and likely not ambulatory.  Family did not answer the phone for clarification.  Balance  Overall balance assessment Needs assistance  Sitting-balance support Single extremity supported  Sitting  balance-[name] Fair  Standing balance-[name] Zero  Standing balance comment strong left lateral lean and flexed trunk/cervical spine. Pt able to attempt to correct but unable to full extend trunk to neutral and unable to maintain the minimal corrective changes he was able to achieve.   General Comments  General comments (skin integrity, edema, etc.) high risk for skin break down due to decrease ability to reposition and FTT. PT could benefit from air mattress overaly.   PT - End of Session  Equipment Utilized During Treatment Gait belt  Activity Tolerance Patient tolerated treatment well;Patient limited by fatigue  Patient left in bed;with call bell/phone within reach;with bed alarm set (OT still in room )  Nurse Communication Mobility status  PT Assessment  PT Recommendation/Assessment Patient needs continued PT services  PT Visit Diagnosis Muscle weakness (generalized) (M62.81);Difficulty in walking, not elsewhere classified (R26.2);Hemiplegia and hemiparesis  Hemiplegia - Right/Left  (bil infarcts and external capsule and cerebellar.)  Hemiplegia - caused by Cerebral infarction  PT Problem List Decreased strength;Decreased activity tolerance;Decreased balance;Decreased mobility;Decreased coordination;Decreased safety awareness  Barriers to Discharge Decreased caregiver support  PT Plan  PT Frequency (ACUTE ONLY) Min 3X/week  PT Treatment/Interventions (ACUTE ONLY) DME instruction;Functional mobility training;Therapeutic activities;Balance training;Neuromuscular re-education;Patient/family education  AM-PAC PT "6 Clicks" Mobility Outcome Measure (Version 2)  Help needed turning from your back to your side while in a flat bed without using bedrails? 2  Help needed moving from lying on your back to sitting on the side of a flat bed without using bedrails? 1  Help needed moving to and from a bed to a chair (including a wheelchair)? 1  Help needed standing up from a chair using your  arms (e.g., wheelchair or bedside chair)? 1  Help needed to walk in hospital room? 1  Help needed climbing 3-5 steps with a railing?  1  6 Click Score 7  Consider Recommendation of Discharge To: CIR/SNF/LTACH  PT Recommendation  Follow Up Recommendations SNF;Supervision/Assistance - 24 hour  PT equipment Other (comment) (TBA)  Individuals Consulted  Consulted and Agree with Results and Recommendations Patient unable/family or caregiver not available  Acute Rehab PT Goals  Patient Stated Goal none stated  PT Goal Formulation Patient unable to participate in goal setting  Time For Goal Achievement 04/23/19  Potential to Achieve Goals Good  PT Time Calculation  PT Start Time (ACUTE ONLY) 0818  PT Stop Time (ACUTE ONLY) 0839  PT Time Calculation (min) (ACUTE ONLY) 21 min  PT General Charges  $$ ACUTE PT VISIT 1 Visit  PT Treatments  $Therapeutic Activity 8-22 mins  Written Expression  Dominant Hand Left    Rolinda Roan, PT, DPT Acute Rehabilitation Services Pager: [phone] Office: [phone]

## 2019-04-13 ENCOUNTER — Inpatient Hospital Stay (HOSPITAL_COMMUNITY): Payer: Medicare Other

## 2019-04-13 MED ORDER — RESOURCE THICKENUP CLEAR PO POWD
ORAL | Status: DC | PRN
  Filled 2019-04-13: qty 125

## 2019-04-13 MED ORDER — POLYETHYLENE GLYCOL 3350 17 G PO PACK
17.0000 g | PACK | Freq: Every day | ORAL | Status: DC
  Administered 2019-04-13 – 2019-04-15 (×3): 17 g via ORAL
  Filled 2019-04-13 (×3): qty 1

## 2019-04-13 NOTE — Progress Notes (Signed)
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  Apparently, patient's daughter went to visit their parents, wife did not want patient to come to the hospital, she found him speaking minimally and more slurred.  In the emergency room, he was found with hypernatremia, sodium of 152, dehydration and MRI brain showed possible infarct in the left posterior external capsule.   Assessment & Plan:   Active Problems:   Hypernatremia   Altered mental status   History of CVA (cerebrovascular accident)   Hypercalcemia   CVA (cerebral vascular accident) (HCC)  Acute ischemic stroke: With history of stroke and right hemiparesis.  Acute metabolic encephalopathy due to stroke and electrolyte abnormalities. Clinical findings, more confusion, recurrent fall and dysarthria and suspected right hemiparesis. CT head findings, negative. MRI of the brain, probable small acute infarct posterior external capsule.  Previous advanced ischemic changes.  Chronic hemorrhage right cerebral infarct present on MRI from 2005 with acute cerebral hemorrhage. Carotid Doppler, no significant stenosis 2D echocardiogram, 45 to 50% ejection fraction. Antiplatelet therapy, not on any treatment at home.  Currently on aspirin and Plavix for 3 weeks and then aspirin alone as per neurology recommendation. LDL 59, no indication for statin. Hemoglobin A1c, 5.7.  No indication for treatment. DVT prophylaxis, SCDs. Therapy recommendations, skilled nursing facility.  Hypernatremia: Due to dehydration and free water deficit.  Treated with dextrose infusion.  Now adequate oral intake.  Improved.    Hypercalcemia: Due to dehydration. Improved and  normalized.    Abnormal urinalysis: Insignificant growth.  No indication for antibiotics.  Multiple skin tears present on admission: Skin tear at buttock, skin tear left knee, skin tear left medial side of the toe.  Local wound care.  Patient is medically stabilizing.  He can be transferred to skilled nursing facility if available. COVID-19 test in anticipation for discharge to nursing home. Bowel regimen added.  DVT prophylaxis: SCDs Code Status: Full code Family Communication: none.  Social worker trying to reach patient's guardian. Disposition Plan: SNF when available.  Medically stable to transfer.   Consultants:   Neurology  Procedures:   None  Antimicrobials:   None   Subjective: Patient seen and examined.  No overnight events.  He has some difficulty communicating due to dysphonia, however he is alert oriented.  Denies any complaints today. Objective: Vitals:   04/12/19 2014 04/13/19 0000 04/13/19 0338 04/13/19 0814  BP: 105/77 (!) 143/87 134/74 127/67  Pulse: 100 (!) 101 (!) 106 93  Resp:  18 18 13   Temp: 98.5 F (36.9 C) 98.2 F (36.8 C) 98.2 F (36.8 C) 98.1 F (36.7 C)  TempSrc: Oral Oral Oral Oral  SpO2: 100% 100% 100% 100%  Weight:        Intake/Output Summary (Last 24 hours) at 04/13/2019 1014 Last data filed at 04/13/2019 0400 Gross per 24 hour  Intake -  Output 275 ml  Net -275 ml   Filed Weights   04/10/19 0500 04/11/19 0500 04/12/19 0500  Weight: 52.9 kg 51.8 kg 48.3 kg    Examination:  General exam: Appears calm and comfortable, chronically sick looking.  Dysphonic and dysarthric intermittently. Cachectic. Respiratory system: Clear to auscultation. Respiratory effort normal. Cardiovascular system: S1 & S2 heard, RRR. No JVD, murmurs, rubs, gallops or  clicks. No pedal edema. Gastrointestinal system: Abdomen is nondistended, soft and nontender. No organomegaly or masses felt. Normal bowel sounds heard. Central nervous system: Alert and  awake.  Mostly oriented. Mild right facial droop.  Dysphonia. Both upper extremities, 4/ 5.  He is able to move both hands, able to clap with some uncoordination. Both lower extremities 3/5.  Poorly moving.  Right leg is possibly more weaker than left. Skin: No rashes, lesions or ulcers, multiple skin tears. Psychiatry: Judgement and insight appear normal. Mood & affect flat.    Data Reviewed: I have personally reviewed following labs and imaging studies  CBC: Recent Labs  Lab 04/07/19 2214 04/08/19 0500 04/09/19 0316 04/10/19 0550  WBC 11.5* 9.8 9.1 7.2  NEUTROABS 9.3*  --   --  4.8  HGB 12.6* 11.7* 12.2* 11.8*  HCT 41.4 38.2* 38.7* 38.0*  MCV 91.4 92.7 89.4 90.0  PLT 238 260 298 262   Basic Metabolic Panel: Recent Labs  Lab 04/07/19 2214 04/08/19 0500 04/09/19 0316 04/10/19 0550  NA 152* 144 144 140  K 4.1 3.9 3.6 4.6  CL 113* 104 104 103  CO2 27 28 27 23   GLUCOSE 100* 160* 102* 96  BUN 53* 48* 30* 31*  CREATININE 1.17 1.07 1.02 0.99  CALCIUM 10.5* 9.8 9.7 9.4  MG  --   --   --  1.9  PHOS  --  3.4  --  3.2   GFR: CrCl cannot be calculated (Unknown ideal weight.). Liver Function Tests: Recent Labs  Lab 04/07/19 2214 04/08/19 0500 04/09/19 0316  AST 67*  --  50*  ALT 62*  --  50*  ALKPHOS 66  --  59  BILITOT 0.9  --  0.5  PROT 8.1  --  7.3  ALBUMIN 3.5 3.1* 2.8*   No results for input(s): LIPASE, AMYLASE in the last 168 hours. No results for input(s): AMMONIA in the last 168 hours. Coagulation Profile: No results for input(s): INR, PROTIME in the last 168 hours. Cardiac Enzymes: No results for input(s): CKTOTAL, CKMB, CKMBINDEX, TROPONINI in the last 168 hours. BNP (last 3 results) No results for input(s): PROBNP in the last 8760 hours. HbA1C: No results for input(s): HGBA1C in the last 72 hours. CBG: Recent Labs  Lab 04/08/19 1716  GLUCAP 102*   Lipid Profile: No results for input(s): CHOL, HDL, LDLCALC, TRIG, CHOLHDL, LDLDIRECT in the last  72 hours. Thyroid Function Tests: No results for input(s): TSH, T4TOTAL, FREET4, T3FREE, THYROIDAB in the last 72 hours. Anemia Panel: No results for input(s): VITAMINB12, FOLATE, FERRITIN, TIBC, IRON, RETICCTPCT in the last 72 hours. Sepsis Labs: No results for input(s): PROCALCITON, LATICACIDVEN in the last 168 hours.  Recent Results (from the past 240 hour(s))  SARS Coronavirus 2 [facility] order, Performed in [facility] hospital lab) Nasopharyngeal Nasopharyngeal Swab     Status: None   Collection Time: 04/07/19  7:10 PM   Specimen: Nasopharyngeal Swab  Result Value Ref Range Status   SARS Coronavirus 2 NEGATIVE NEGATIVE Final    Comment: (NOTE) If result is NEGATIVE SARS-CoV-2 target nucleic acids are NOT DETECTED. The SARS-CoV-2 RNA is generally detectable in upper and lower  respiratory specimens during the acute phase of infection. The lowest  concentration of SARS-CoV-2 viral copies this assay can detect is 250  copies / mL. A negative result does not preclude SARS-CoV-2 infection  and should not be used as the sole basis for treatment or other  patient management decisions.  A negative  result may occur with  improper specimen collection / handling, submission of specimen other  than nasopharyngeal swab, presence of viral mutation(s) within the  areas targeted by this assay, and inadequate number of viral copies  (<250 copies / mL). A negative result must be combined with clinical  observations, patient history, and epidemiological information. If result is POSITIVE SARS-CoV-2 target nucleic acids are DETECTED. The SARS-CoV-2 RNA is generally detectable in upper and lower  respiratory specimens dur ing the acute phase of infection.  Positive  results are indicative of active infection with SARS-CoV-2.  Clinical  correlation with patient history and other diagnostic information is  necessary to determine patient infection status.  Positive results do  not rule out  bacterial infection or co-infection with other viruses. If result is PRESUMPTIVE POSTIVE SARS-CoV-2 nucleic acids MAY BE PRESENT.   A presumptive positive result was obtained on the submitted specimen  and confirmed on repeat testing.  While 2019 novel coronavirus  (SARS-CoV-2) nucleic acids may be present in the submitted sample  additional confirmatory testing may be necessary for epidemiological  and / or clinical management purposes  to differentiate between  SARS-CoV-2 and other Sarbecovirus currently known to infect humans.  If clinically indicated additional testing with an alternate test  methodology [phone]) is advised. The SARS-CoV-2 RNA is generally  detectable in upper and lower respiratory sp ecimens during the acute  phase of infection. The expected result is Negative. Fact Sheet for Patients:  StrictlyIdeas.no Fact Sheet for Healthcare Providers: BankingDealers.co.za This test is not yet approved or cleared by the [place] FDA and has been authorized for detection and/or diagnosis of SARS-CoV-2 by FDA under an Emergency Use Authorization (EUA).  This EUA will remain in effect (meaning this test can be used) for the duration of the COVID-19 declaration under Section 564(b)(1) of the Act, 21 U.S.C. section 360bbb-3(b)(1), unless the authorization is terminated or revoked sooner. Performed at [facility], [place] [address], [place], [place] [address]   Culture, Urine     Status: Abnormal   Collection Time: 04/08/19  2:27 AM   Specimen: Urine, Clean Catch  Result Value Ref Range Status   Specimen Description   Final    URINE, CLEAN CATCH Performed at [facility], [place] [address] [place], [place] [address]    Special Requests   Final    NONE Performed at [facility], [place] [address] [place], [place] [address]    Culture (A)  Final    30,000 COLONIES/mL  MULTIPLE SPECIES PRESENT, SUGGEST RECOLLECTION   Report Status 04/09/2019 FINAL  Final         Radiology Studies: No results found.      Scheduled Meds: . aspirin EC  81 mg Oral Daily  . clopidogrel  75 mg Oral Daily  . polyethylene glycol  17 g Oral Daily   Continuous Infusions:   LOS: 5 days    Time spent: 25 minutes    Barb Merino, MD [facility] Pager [phone]  If 7PM-7AM, please contact night-coverage [email or web address] Password [email or web address] 04/13/2019, 10:14 AM

## 2019-04-13 NOTE — Plan of Care (Signed)
  Problem: Self-Care: Goal: Ability to participate in self-care as condition permits will improve Outcome: Progressing Goal: Verbalization of feelings and concerns over difficulty with self-care will improve Outcome: Progressing Goal: Ability to communicate needs accurately will improve Outcome: Progressing   Problem: Nutrition: Goal: Risk of aspiration will decrease Outcome: Progressing Goal: Dietary intake will improve Outcome: Not Progressing   Problem: Nutrition: Goal: Adequate nutrition will be maintained Outcome: Progressing   Problem: Nutrition: Goal: Adequate nutrition will be maintained Outcome: Progressing   Problem: Pain Managment: Goal: General experience of comfort will improve Outcome: Progressing   Problem: Skin Integrity: Goal: Risk for impaired skin integrity will decrease Outcome: Progressing

## 2019-04-13 NOTE — Progress Notes (Signed)
Modified Barium Swallow Progress Note  Patient Details  Name: Raymond Knight MRN: [id number] Date of Birth: 01/27/1946  Today's Date: 04/13/2019  Modified Barium Swallow completed.  Full report located under Chart Review in the Imaging Section.  Brief recommendations include the following:  Clinical Impression  Pt presents with an oropharyngeal dysphagia marked by decreased bolus cohesion/control of purees and liquids; initiation of pharyngeal swallow at level of pyriforms for thin liquids; immediate and moderate aspiration of thin liquids with a resulting cough response (not effective in expelling aspirate).  Nectar thick liquids did not penetrate the larynx, nor were they aspirated.  Malfunctioning of fluoroscopy prevented review of MBS post-study and in-depth analysis of biomechanical deficits.  Pt appears to be safest on a dysphagia 1 diet with nectar-thick liquids; meds should be given whole in puree.  Recommend treatment at SNF to focus on oral preparation and thin liquid trials as tolerated.     Swallow Evaluation Recommendations       SLP Diet Recommendations: Dysphagia 1 (Puree) solids;Nectar thick liquid   Liquid Administration via: Cup   Medication Administration: Whole meds with puree   Supervision: Patient able to self feed;Staff to assist with self feeding   Compensations: Minimize environmental distractions   Postural Changes: Seated upright at 90 degrees   Oral Care Recommendations: Oral care BID      Maxime Beckner L. Tivis Ringer, [place] Office number [phone] Pager [phone]   [name] 04/13/2019,2:24 PM

## 2019-04-13 NOTE — NC FL2 (Signed)
  [place] LEVEL OF CARE SCREENING TOOL     IDENTIFICATION  Patient Name: Raymond Knight Birthdate: [birth year] Sex: male Admission Date (Current Location): 04/07/2019  [facility] and [place] Number:  Herbalist and Address:  [facility] [place], [place] [address], [place], [place] [address]      Provider Number: [id number]  Attending Physician Name and Address:  Barb Merino, MD  Relative Name and Phone Number:       Current Level of Care: Hospital Recommended Level of Care: [place] Prior Approval Number:    Date Approved/Denied:   PASRR Number: [id number]  Discharge Plan: SNF    Current Diagnoses: Patient Active Problem List   Diagnosis Date Noted  . Hypernatremia 04/08/2019  . Altered mental status 04/08/2019  . History of CVA (cerebrovascular accident) 04/08/2019  . Hypercalcemia 04/08/2019  . CVA (cerebral vascular accident) ([place]) 04/08/2019    Orientation RESPIRATION BLADDER Height & Weight     Self, Time, Situation, Place  Normal Incontinent Weight: 106 lb 7.7 oz (48.3 kg) Height:     BEHAVIORAL SYMPTOMS/MOOD NEUROLOGICAL BOWEL NUTRITION STATUS      Continent Diet(see DC summary)  AMBULATORY STATUS COMMUNICATION OF NEEDS Skin   Extensive Assist Verbally Normal                       Personal Care Assistance Level of Assistance  Bathing, Dressing, Feeding Bathing Assistance: Maximum assistance Feeding assistance: Maximum assistance Dressing Assistance: Maximum assistance     Functional Limitations Info  Sight, Hearing, Speech Sight Info: Adequate Hearing Info: Adequate Speech Info: Impaired(delayed responses, expressive aphasia)    SPECIAL CARE FACTORS FREQUENCY  PT (By licensed PT), OT (By licensed OT), Speech therapy     PT Frequency: 5x/wk OT Frequency: 5x/wk     Speech Therapy Frequency: 5x/wk      Contractures Contractures Info: Not present    Additional Factors Info   Allergies, Code Status Code Status Info: Full Allergies Info: NKA           Current Medications (04/13/2019):  This is the current hospital active medication list Current Facility-Administered Medications  Medication Dose Route Frequency Provider Last Rate Last Dose  . acetaminophen (TYLENOL) tablet 650 mg  650 mg Oral Q4H PRN Eugenie Filler, MD       Or  . acetaminophen (TYLENOL) solution 650 mg  650 mg Per Tube Q4H PRN Eugenie Filler, MD       Or  . acetaminophen (TYLENOL) suppository 650 mg  650 mg Rectal Q4H PRN Eugenie Filler, MD      . aspirin EC tablet 81 mg  81 mg Oral Daily Zada Finders R, MD   81 mg at 04/13/19 1045  . clopidogrel (PLAVIX) tablet 75 mg  75 mg Oral Daily Barb Merino, MD   75 mg at 04/13/19 1045  . polyethylene glycol (MIRALAX / GLYCOLAX) packet 17 g  17 g Oral Daily Barb Merino, MD   17 g at 04/13/19 1045  . Resource ThickenUp Clear   Oral PRN Barb Merino, MD      . senna-docusate (Senokot-S) tablet 1 tablet  1 tablet Oral QHS PRN Eugenie Filler, MD         Discharge Medications: Please see discharge summary for a list of discharge medications.  Relevant Imaging Results:  Relevant Lab Results:   Additional Information SS#: [id number]  Geralynn Ochs, LCSW

## 2019-04-14 LAB — SARS CORONAVIRUS 2 (TAT 6-24 HRS): SARS Coronavirus 2: NEGATIVE

## 2019-04-14 MED ORDER — ASPIRIN 81 MG PO TBEC: 81.0000 mg | DELAYED_RELEASE_TABLET | Freq: Every day | ORAL | Status: AC

## 2019-04-14 MED ORDER — RESOURCE THICKENUP CLEAR PO POWD: 1.0000 | ORAL | Status: AC | PRN

## 2019-04-14 MED ORDER — CLOPIDOGREL BISULFATE 75 MG PO TABS: 75.0000 mg | ORAL_TABLET | Freq: Every day | ORAL | 0 refills | Status: AC

## 2019-04-14 MED ORDER — POLYETHYLENE GLYCOL 3350 17 G PO PACK: 17.0000 g | PACK | Freq: Every day | ORAL | 0 refills | Status: AC | PRN

## 2019-04-14 NOTE — Progress Notes (Signed)
Physical Therapy Treatment Patient Details Name: Raymond Knight F Jef[place] MRN: [id number] DOB: 23-May-1946 Today's Date: 04/14/2019    History of Present Illness 73 yo male admitted with MRI(+) L posterior external capsule. PMH: HTN, HLD, CVA R residual weakness ORIF L hip    PT Comments    Patient seen for mobility progression. Pt requires mod/max A +2 for bed mobility and functional transfer training. Continue to progress as tolerated with anticipated d/c to SNF for further skilled PT services.     Follow Up Recommendations  SNF;Supervision/Assistance - 24 hour     Equipment Recommendations  Other (comment)(TBA)    Recommendations for Other Services       Precautions / Restrictions Precautions Precautions: Fall Restrictions Weight Bearing Restrictions: No    Mobility  Bed Mobility Overal bed mobility: Needs Assistance Bed Mobility: Supine to Sit     Supine to sit: Mod assist;+2 for physical assistance;HOB elevated     General bed mobility comments: cues for sequencing; assist at bilat LE/hips with bed pad and to elevate trunk into sitting  Transfers Overall transfer level: Needs assistance Equipment used: Rolling walker (2 wheeled)(+2 face to face with gait belt) Transfers: Sit to/from Stand;Stand Pivot Transfers Sit to Stand: From elevated surface;Max assist;+2 physical assistance   Squat pivot transfers: Max assist;+2 physical assistance     General transfer comment: pt stood X 2 from EOB and unable to achieve upright posture; +2 assist to pivot to drop arm recliner   Ambulation/Gait             General Gait Details: unable; likely pt was non ambulatory PTA given bilat knee flexion contractures and inability to stand upright    Stairs             Wheelchair Mobility    Modified Rankin (Stroke Patients Only)       Balance Overall balance assessment: Needs assistance Sitting-balance support: Feet supported;No upper extremity supported Sitting  balance-[name]: Fair       Standing balance-[name]: Zero                              Cognition Arousal/Alertness: Awake/alert Behavior During Therapy: WFL for tasks assessed/performed Overall Cognitive Status: No family/caregiver present to determine baseline cognitive functioning                                 General Comments: pt reports that he uses RW at home and replied "yes" when asked if he mainly uses w/c at home      Exercises      General Comments        Pertinent Vitals/Pain Pain Assessment: No/denies pain    Home Living                      Prior Function            PT Goals (current goals can now be found in the care plan section) Acute Rehab PT Goals Patient Stated Goal: none stated Progress towards PT goals: Progressing toward goals    Frequency    Min 3X/week      PT Plan Current plan remains appropriate    Co-evaluation PT/OT/SLP Co-Evaluation/Treatment: Yes Reason for Co-Treatment: Complexity of the patient's impairments (multi-system involvement);Necessary to address cognition/behavior during functional activity;For patient/therapist safety;To address functional/ADL transfers PT goals addressed during session: Mobility/safety with  mobility;Balance        AM-PAC PT "6 Clicks" Mobility   Outcome Measure  Help needed turning from your back to your side while in a flat bed without using bedrails?: A Lot Help needed moving from lying on your back to sitting on the side of a flat bed without using bedrails?: A Lot Help needed moving to and from a bed to a chair (including a wheelchair)?: A Lot Help needed standing up from a chair using your arms (e.g., wheelchair or bedside chair)?: A Lot Help needed to walk in hospital room?: Total Help needed climbing 3-5 steps with a railing? 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

## 2019-04-14 NOTE — Progress Notes (Signed)
Occupational Therapy Treatment Patient Details Name: Raymond Knight MRN: [id number] DOB: 18-Dec-1945 Today's Date: 04/14/2019    History of present illness 73 yo male admitted with MRI(+) L posterior external capsule. PMH: HTN, HLD, CVA R residual weakness ORIF L hip   OT comments  Pt progressing well today. Pt attempting to eat tray of food on lap and made a mess on his chest and was using hands instead of spoon. Pt repositioned at end of session with food in front with proper utensils and a clean gown donned. RUE weakness noted with 3-/5 MM grade; LUE mostly used for ADL grooming tasks at EOB and in supine. Pt stood X 2 from EOB  and unable to achieve upright posture; maxA+2 assist to pivot to drop arm recliner. Pt continues to progress and would benefit from continued OT skilled services. OT following acutely.   Follow Up Recommendations  SNF;Supervision/Assistance - 24 hour    Equipment Recommendations  Other (comment)(to be determined at next venue)    Recommendations for Other Services      Precautions / Restrictions Precautions Precautions: Fall Restrictions Weight Bearing Restrictions: No       Mobility Bed Mobility Overal bed mobility: Needs Assistance Bed Mobility: Supine to Sit     Supine to sit: Mod assist;+2 for physical assistance;HOB elevated     General bed mobility comments: cues for sequencing; assist at bilat LE/hips with bed pad and to elevate trunk into sitting  Transfers Overall transfer level: Needs assistance Equipment used: Rolling walker (2 wheeled)(+2 face to face with gait belt) Transfers: Sit to/from Stand;Stand Pivot Transfers Sit to Stand: From elevated surface;Max assist;+2 physical assistance   Squat pivot transfers: Max assist;+2 physical assistance     General transfer comment: pt stood X 2 from EOB and unable to achieve upright posture; +2 assist to pivot to drop arm recliner     Balance Overall balance assessment: Needs  assistance Sitting-balance support: Feet supported;No upper extremity supported Sitting balance-[name]: Fair       Standing balance-[name]: Zero                             ADL either performed or assessed with clinical judgement   ADL Overall ADL's : Needs assistance/impaired Eating/Feeding: Set up;Bed level Eating/Feeding Details (indicate cue type and reason): Pt attempting to eat tray of food on lap and made a mess on his chest and was using hands instead of spoon. Pt repositioned at end of session with food in front with proper utensils and a clean gown donned. Grooming: Min guard;Sitting   Upper Body Bathing: Minimal assistance;Sitting Upper Body Bathing Details (indicate cue type and reason): requiring cues to clean entire chest                         Functional mobility during ADLs: Moderate assistance;+2 for physical assistance;+2 for safety/equipment       Vision       Perception     Praxis      Cognition Arousal/Alertness: Awake/alert Behavior During Therapy: WFL for tasks assessed/performed Overall Cognitive Status: No family/caregiver present to determine baseline cognitive functioning                                 General Comments: replying "yes" to using w.c at home  Exercises     Shoulder Instructions       General Comments Pt's eyes appear to have deficits as pt unable to find spoon on tray    Pertinent Vitals/ Pain       Pain Assessment: No/denies pain  Home Living                                          Prior Functioning/Environment              Frequency  Min 2X/week        Progress Toward Goals  OT Goals(current goals can now be found in the care plan section)  Progress towards OT goals: Progressing toward goals  Acute Rehab OT Goals Patient Stated Goal: none stated OT Goal Formulation: Patient unable to participate in goal setting ADL Goals Pt  Will Perform Grooming: with set-up;sitting Pt Will Perform Upper Body Dressing: with set-up;sitting Pt Will Perform Lower Body Dressing: sit to/from stand;sitting/lateral leans;with min guard assist Pt Will Transfer to Toilet: with min assist;bedside commode Additional ADL Goal #1: Pt will increase to x10 mins OOB ADL or EOB ADL with sustained attention for 2 mins at a time.  Plan Discharge plan remains appropriate    Co-evaluation    PT/OT/SLP Co-Evaluation/Treatment: Yes Reason for Co-Treatment: Complexity of the patient's impairments (multi-system involvement)   OT goals addressed during session: ADL's and self-care      AM-PAC OT "6 Clicks" Daily Activity     Outcome Measure   Help from another person eating meals?: A Little Help from another person taking care of personal grooming?: A Little Help from another person toileting, which includes using toliet, bedpan, or urinal?: Total Help from another person bathing (including washing, rinsing, drying)?: A Lot Help from another person to put on and taking off regular upper body clothing?: A Lot Help from another person to put on and taking off regular lower body clothing?: Total 6 Click Score: 12    End of Session Equipment Utilized During Treatment: Gait belt  OT Visit Diagnosis: Unsteadiness on feet (R26.81);Muscle weakness (generalized) (M62.81)   Activity Tolerance Patient tolerated treatment well   Patient Left in chair;with call bell/phone within reach;with chair alarm set   Nurse Communication Mobility status;Precautions        Time: 8657-8469 OT Time Calculation (min): 31 min  Charges: OT General Charges $OT Visit: 1 Visit OT Treatments $Self Care/Home Management : 8-22 mins  Raymond Knight) Raymond Knight OTR/L Acute Rehabilitation Services Pager: [phone] Office: [phone]    Raymond Knight Raymond Knight 04/14/2019, 3:52 PM

## 2019-04-14 NOTE — Discharge Summary (Addendum)
Physician Discharge Summary  BRYCIN KILLE ZOX:[id number] DOB: 1946-03-21 DOA: 04/07/2019  PCP: Patient, No Pcp Per  Admit date: 04/07/2019 Discharge date: 04/14/2019  Admitted From: Home. Disposition: Skilled nursing facility.  Recommendations for Outpatient Follow-up:  1. Follow up with PCP in 1-2 weeks  Home Health: Not applicable Equipment/Devices: Not applicable  Discharge Condition: Stable CODE STATUS: Full code Diet recommendation: Regular diet, dysphagia 1 diet with nectar thick liquid.  Continue to follow with speech therapy.  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  Apparently, patient's daughter went to visit their parents, wife did not want patient to come to the hospital, she found him speaking minimally and more slurred.  In the emergency room, he was found with hypernatremia, sodium of 152, dehydration and MRI brain showed possible infarct in the left posterior external capsule.    Discharge Diagnoses:  Active Problems:   Hypernatremia   Altered mental status   History of CVA (cerebrovascular accident)   Hypercalcemia   CVA (cerebral vascular accident) (HCC)  Acute ischemic stroke: With history of stroke and right hemiparesis.  Acute metabolic encephalopathy due to stroke and electrolyte abnormalities. Clinical findings, more confusion, recurrent fall and dysarthria and suspected right hemiparesis.  Improved. CT head findings, negative. MRI of the brain, probable small acute infarct posterior external capsule.  Previous advanced ischemic changes.  Chronic hemorrhage right cerebral infarct present on MRI from 2005 with acute cerebral hemorrhage. Carotid Doppler, no significant stenosis 2D echocardiogram, 45 to 50% ejection fraction. Antiplatelet therapy, not on any treatment at  home.  Currently on aspirin and Plavix for 3 weeks and then aspirin alone as per neurology recommendation. LDL 59, no indication for statin. Hemoglobin A1c, 5.7.  No indication for treatment.  Hypernatremia and hypocalcemia: Due to dehydration and free water deficit.    Multiple skin tears present on admission: Skin tear at buttock, skin tear left knee, skin tear left medial side of the toe.  Local wound care.  Medically stable to transfer to skilled level of care to continue inpatient therapies.  Discharge Instructions  Discharge Instructions    Diet - low sodium heart healthy   Complete by: As directed    Dysphagias 1 diet with nectar thick liquids.   Increase activity slowly   Complete by: As directed      Allergies as of 04/14/2019   No Known Allergies     Medication List    TAKE these medications   aspirin 81 MG EC tablet Take 1 tablet (81 mg total) by mouth daily.   clopidogrel 75 MG tablet Commonly known as: PLAVIX Take 1 tablet (75 mg total) by mouth daily for 21 days.   polyethylene glycol 17 g packet Commonly known as: MIRALAX / GLYCOLAX Take 17 g by mouth daily as needed for mild constipation.   Resource ThickenUp Clear Powd Take 120 g by mouth as needed.       No Known Allergies  Consultations:  Neurology   Procedures/Studies: Ct Head Wo Contrast  Result Date: 04/07/2019 CLINICAL DATA:  Patient status post fall today.  Initial encounter. EXAM: CT HEAD WITHOUT CONTRAST TECHNIQUE: Contiguous axial images were obtained from the base of the skull through the vertex without intravenous contrast. COMPARISON:  Head CT scan 02/18/2019. FINDINGS: Brain: No evidence of acute infarction, hemorrhage, hydrocephalus, extra-axial collection or mass lesion/mass effect. Atrophy, chronic microvascular ischemic change, remote bilateral cerebellar  infarcts and bilateral basal ganglia infarcts all again seen. Vascular: Extensive atherosclerosis.  No hyperdense vessel.  Skull: Intact.  No focal lesion. Sinuses/Orbits: Mucous retention cyst or polyp left maxillary sinus noted. Other: None. IMPRESSION: No acute intracranial abnormality. Atrophy, chronic microvascular ischemic change remote infarcts as described above. Extensive atherosclerosis. Electronically Signed   By: Drusilla Kannerhomas  Dalessio M.D.   On: 04/07/2019 21:17   Mr Brain Wo Contrast  Result Date: 04/08/2019 CLINICAL DATA:  Subacute neuro deficit.  Altered mental status. EXAM: MRI HEAD WITHOUT CONTRAST TECHNIQUE: Multiplanar, multiecho pulse sequences of the brain and surrounding structures were obtained without intravenous contrast. COMPARISON:  CT head 04/07/2019 FINDINGS: Brain: Moderate atrophy. Extensive chronic ischemic changes in the white matter and thalamus and basal ganglia bilaterally. Chronic lacunar infarction left pons with mild ischemic change in the right pons. Chronic hemorrhagic infarct right cerebellum. Mild chronic ischemia in the left cerebellum. Chronic microhemorrhage left parietal cortex and left posterior thalamus. Chronic hemorrhage in the right cerebellar infarct. Motion degraded study. Possible small area of acute infarct in the posterior external capsule on the left. This is only seen on axial diffusion and not confirmed on coronal diffusion. Vascular: Normal vascular flow voids Skull and upper cervical spine: Negative Sinuses/Orbits: Mucosal edema paranasal sinuses. Bilateral cataract surgery Other: None IMPRESSION: Motion degraded study. Possible small acute infarct posterior external capsule left Atrophy and advanced chronic ischemic changes as above. Electronically Signed   By: Marlan Palauharles  Clark M.D.   On: 04/08/2019 10:15   Dg Chest Portable 1 View  Result Date: 04/07/2019 CLINICAL DATA:  Altered mental status EXAM: PORTABLE CHEST 1 VIEW COMPARISON:  12/16/2007 FINDINGS: Hyperinflated lungs. No focal airspace disease or effusion. Normal cardiomediastinal silhouette. No pneumothorax. Stable  right apically pleural and parenchymal scarring. IMPRESSION: No active disease.  Pulmonary hyperinflation Electronically Signed   By: Jasmine PangKim  Fujinaga M.D.   On: 04/07/2019 19:47   Dg Swallowing Func-speech Pathology  Result Date: 04/13/2019 Objective Swallowing Evaluation: Type of Study: MBS-Modified Barium Swallow Study  Patient Details Name: Bartholomew Boardsndrew F Topel MRN: [id number] Date of Birth: Nov 29, 1945 Today's Date: 04/13/2019 Time: SLP Start Time (ACUTE ONLY): 1330 -SLP Stop Time (ACUTE ONLY): 1355 SLP Time Calculation (min) (ACUTE ONLY): 25 min Past Medical History: Past Medical History: Diagnosis Date . Anemia  . Dysarthria  . Hyperlipidemia  . Hypertension  . Hyponatremia  . Renal insufficiency  . Stroke [facility](HCC) 2007 Past Surgical History: Past Surgical History: Procedure Laterality Date . colonoscopy with biopsy  12/18/2007 . ESOPHAGOGASTRODUODENOSCOPY ENDOSCOPY  12/18/2007 . ORIF Left Hip fracture  01/23/2007 HPI: Pt is a 73 yo male admitted with acute external capsule CVA.  MRI: Moderate atrophy. Extensive chronic ischemic changes in the white matter and thalamus and basal ganglia bilaterally. Chronic lacunar infarction left pons with mild ischemic change in the right pons. Chronic hemorrhagic infarct right cerebellum. Mild chronic ischemia in the left cerebellum. PMHx: HTN, HLD, CVA R residual weakness, ORIF L hip.  Subjective: alert Assessment / Plan / Recommendation CHL IP CLINICAL IMPRESSIONS 04/13/2019 Clinical Impression Pt presents with an oropharyngeal dysphagia marked by decreased bolus cohesion/control of purees and liquids; initiation of pharyngeal swallow at level of pyriforms for thin liquids; immediate and moderate aspiration of thin liquids with a resulting cough response (not effective in expelling aspirate).  Nectar thick liquids did not penetrate the larynx, nor were they aspirated.  Malfunctioning of fluoroscopy prevented review of MBS post-study and in-depth analysis of biomechanical deficits.  Pt  appears to be safest on a  dysphagia 1 diet with nectar-thick liquids; meds should be given whole in puree.  Recommend treatment at SNF to focus on oral preparation and thin liquid trials as tolerated.   SLP Visit Diagnosis Dysphagia, oropharyngeal phase (R13.12) Attention and concentration deficit following -- Frontal lobe and executive function deficit following -- Impact on safety and function Moderate aspiration risk   CHL IP TREATMENT RECOMMENDATION 04/13/2019 Treatment Recommendations Therapy as outlined in treatment plan below   Prognosis 04/13/2019 Prognosis for Safe Diet Advancement Fair Barriers to Reach Goals Cognitive deficits Barriers/Prognosis Comment -- CHL IP DIET RECOMMENDATION 04/13/2019 SLP Diet Recommendations Dysphagia 1 (Puree) solids;Nectar thick liquid Liquid Administration via Cup Medication Administration Whole meds with puree Compensations Minimize environmental distractions Postural Changes Seated upright at 90 degrees   CHL IP OTHER RECOMMENDATIONS 04/13/2019 Recommended Consults -- Oral Care Recommendations Oral care BID Other Recommendations --   CHL IP FOLLOW UP RECOMMENDATIONS 04/13/2019 Follow up [facility]ations [facility] D[facility] 04/13/2019 Speech Therapy Frequency (ACUTE ONLY) min 2x/week Treatment Duration 1 week      CHL IP ORAL PHASE 04/13/2019 Oral Phase Impaired Oral - Pudding Teaspoon -- Oral - Pudding Cup -- Oral - Honey Teaspoon -- Oral - Honey Cup -- Oral - Nectar Teaspoon -- Oral - Nectar Cup -- Oral - Nectar Straw -- Oral - Thin Teaspoon -- Oral - Thin Cup -- Oral - Thin Straw -- Oral - Puree Decreased bolus cohesion Oral - Mech Soft -- Oral - Regular -- Oral - Multi-Consistency -- Oral - Pill -- Oral Phase - Comment --  CHL IP PHARYNGEAL PHASE 04/13/2019 Pharyngeal Phase Impaired Pharyngeal- Pudding Teaspoon -- Pharyngeal -- Pharyngeal- Pudding Cup -- Pharyngeal -- Pharyngeal- Honey Teaspoon -- Pharyngeal -- Pharyngeal- Honey Cup -- Pharyngeal --  Pharyngeal- Nectar Teaspoon -- Pharyngeal -- Pharyngeal- Nectar Cup Pharyngeal residue - valleculae Pharyngeal -- Pharyngeal- Nectar Straw -- Pharyngeal -- Pharyngeal- Thin Teaspoon -- Pharyngeal -- Pharyngeal- Thin Cup Penetration/Aspiration before swallow;Moderate aspiration;Reduced airway/laryngeal closure;Delayed swallow initiation-pyriform sinuses Pharyngeal Material enters airway, passes BELOW cords and not ejected out despite cough attempt by patient Pharyngeal- Thin Straw Delayed swallow initiation-pyriform sinuses;Penetration/Aspiration before swallow;Moderate aspiration Pharyngeal Material enters airway, passes BELOW cords and not ejected out despite cough attempt by patient Pharyngeal- Puree Delayed swallow initiation-vallecula;Pharyngeal residue - valleculae Pharyngeal -- Pharyngeal- Mechanical Soft -- Pharyngeal -- Pharyngeal- Regular -- Pharyngeal -- Pharyngeal- Multi-consistency -- Pharyngeal -- Pharyngeal- Pill -- Pharyngeal -- Pharyngeal Comment --  No flowsheet data found. Blenda MountsCouture, Amanda Laurice 04/13/2019, 2:22 PM              [facility] [place] [facility]  Result Date: 04/09/2019 Carotid Arterial Duplex Study Indications:       CVA. Risk Factors:      Hypertension, hyperlipidemia. Comparison Study:  No prior studies. Performing Technologist: Gertie FeyMichelle Simonetti MHA, RDMS, RVT, RDCS  Examination Guidelines: A complete evaluation includes B-mode imaging, spectral Doppler, color Doppler, and power Doppler as needed of all accessible portions of each vessel. Bilateral testing is considered an integral part of a complete examination. Limited examinations for reoccurring indications may be performed as noted.  Right Carotid Findings: +----------+--------+--------+--------+---------------------+------------------+           PSV cm/sEDV cm/sStenosisPlaque Description   Comments           +----------+--------+--------+--------+---------------------+------------------+ CCA Prox  79      20  intimal thickening +----------+--------+--------+--------+---------------------+------------------+ CCA Distal90      20              homogeneous and                                                           smooth                                  +----------+--------+--------+--------+---------------------+------------------+ ICA Prox  42      15              smooth, heterogenous                                                      and calcific                            +----------+--------+--------+--------+---------------------+------------------+ ICA Distal65      21              smooth and                                                                homogeneous                             +----------+--------+--------+--------+---------------------+------------------+ ECA       86      9               heterogenous, focal                                                       and irregular                           +----------+--------+--------+--------+---------------------+------------------+ +----------+--------+-------+----------------+-------------------+           PSV cm/sEDV cmsDescribe        Arm Pressure (mmHG) +----------+--------+-------+----------------+-------------------+ [id number]             Multiphasic, WNL                    +----------+--------+-------+----------------+-------------------+ +---------+--------+--+--------+-+---------+ VertebralPSV cm/s25EDV cm/s6Antegrade +---------+--------+--+--------+-+---------+  Left Carotid Findings: +----------+--------+--------+--------+---------------------+------------------+           PSV cm/sEDV cm/sStenosisPlaque Description   Comments           +----------+--------+--------+--------+---------------------+------------------+ CCA Prox  104     19                                   intimal thickening  +----------+--------+--------+--------+---------------------+------------------+  CCA Distal49      11              smooth and                                                                heterogenous                            +----------+--------+--------+--------+---------------------+------------------+ ICA Prox  35      9               irregular,                                                                heterogenous and                                                          calcific                                +----------+--------+--------+--------+---------------------+------------------+ ICA Distal42      11                                                      +----------+--------+--------+--------+---------------------+------------------+ ECA       45                      smooth and                                                                heterogenous                            +----------+--------+--------+--------+---------------------+------------------+ +----------+--------+--------+----------------+-------------------+           PSV cm/sEDV cm/sDescribe        Arm Pressure (mmHG) +----------+--------+--------+----------------+-------------------+ [id number]              Multiphasic, WNL                    +----------+--------+--------+----------------+-------------------+ +---------+--------+--+--------+--+---------+ VertebralPSV cm/s38EDV cm/s11Antegrade +---------+--------+--+--------+--+---------+  Summary: Right Carotid: Velocities in the right ICA are consistent with a 1-39% stenosis. Left Carotid: Velocities in the left ICA are consistent with a 1-39% stenosis. Vertebrals:  Bilateral vertebral arteries demonstrate antegrade flow. Subclavians: Normal flow hemodynamics were seen in bilateral subclavian  arteries. *See table(s) above for measurements and observations.  Electronically signed by  Antony Contras MD on 04/09/2019 at 2:22:22 PM.    Final    Vas [place] Transcranial Doppler  Result Date: 04/09/2019  Transcranial Doppler Indications: Stroke. History: Hypertension, hyperlipidemia. Limitations: Poor acoustic windows Limitations for diagnostic windows: Unable to insonate left transtemporal window. Unable to insonate occipital window. Comparison Study: No prior study. Performing Technologist: Maudry Mayhew MHA, RDMS, RVT, RDCS  Examination Guidelines: A complete evaluation includes B-mode imaging, spectral Doppler, color Doppler, and power Doppler as needed of all accessible portions of each vessel. Bilateral testing is considered an integral part of a complete examination. Limited examinations for reoccurring indications may be performed as noted.  +---------+-------------+----------+-----------+-------+ RIGHT TCDRight VM (cm)Depth (cm)PulsatilityComment +---------+-------------+----------+-----------+-------+ MCA          51.00                 1.16            +---------+-------------+----------+-----------+-------+ ACA         -45.00                 0.91            +---------+-------------+----------+-----------+-------+ Term ICA     38.00                 1.17            +---------+-------------+----------+-----------+-------+ PCA          19.00                 0.85            +---------+-------------+----------+-----------+-------+ Opthalmic    14.00                 1.38            +---------+-------------+----------+-----------+-------+  +---------+------------+----------+-----------+-------+ LEFT TCD Left VM (cm)Depth (cm)PulsatilityComment +---------+------------+----------+-----------+-------+ Opthalmic   15.00                 1.55            +---------+------------+----------+-----------+-------+  Summary:  Absent left temporal and occipital windows greatly limits study. Normal mean flow velocities in right middle,anterior ,posterior cerebrals,  and bilateral opthalmic arteries. *See table(s) above for measurements and observations.  Diagnosing physician: Antony Contras MD Electronically signed by Antony Contras MD on 04/09/2019 at 2:24:02 PM.    Final       Subjective: Patient seen and examined.  Was sitting in couch today eating breakfast.  Denies any complaints.   Discharge Exam: Vitals:   04/14/19 0328 04/14/19 0826  BP: 126/88 140/86  Pulse: 95 76  Resp: 15 18  Temp: 98.4 F (36.9 C) 98 F (36.7 C)  SpO2: 100% 98%   Vitals:   04/14/19 0016 04/14/19 0328 04/14/19 0500 04/14/19 0826  BP: (!) 127/91 126/88  140/86  Pulse: 89 95  76  Resp: 18 15  18   Temp: 97.9 F (36.6 C) 98.4 F (36.9 C)  98 F (36.7 C)  TempSrc: Oral Oral  Oral  SpO2: 100% 100%  98%  Weight:   48.5 kg     General: Pt is alert, awake, not in acute distress, chronically sick looking, cachectic. Cardiovascular: RRR, S1/S2 +, no rubs, no gallops Respiratory: CTA bilaterally, no wheezing, no rhonchi Abdominal: Soft, NT, ND, bowel sounds + Extremities: no edema, no cyanosis Neuro exam: Mild right facial droop.  Both upper extremities 4/5, probably right more weaker than  left. Both lower extremities with contractures, skin tears, 3/5.  Generalized weakness.    The results of significant diagnostics from this hospitalization (including imaging, microbiology, ancillary and laboratory) are listed below for reference.     Microbiology: Recent Results (from the past 240 hour(s))  SARS Coronavirus 2 [facility] order, Performed in [facility] hospital lab) Nasopharyngeal Nasopharyngeal Swab     Status: None   Collection Time: 04/07/19  7:10 PM   Specimen: Nasopharyngeal Swab  Result Value Ref Range Status   SARS Coronavirus 2 NEGATIVE NEGATIVE Final    Comment: (NOTE) If result is NEGATIVE SARS-CoV-2 target nucleic acids are NOT DETECTED. The SARS-CoV-2 RNA is generally detectable in upper and lower  respiratory specimens during the acute phase of  infection. The lowest  concentration of SARS-CoV-2 viral copies this assay can detect is 250  copies / mL. A negative result does not preclude SARS-CoV-2 infection  and should not be used as the sole basis for treatment or other  patient management decisions.  A negative result may occur with  improper specimen collection / handling, submission of specimen other  than nasopharyngeal swab, presence of viral mutation(s) within the  areas targeted by this assay, and inadequate number of viral copies  (<250 copies / mL). A negative result must be combined with clinical  observations, patient history, and epidemiological information. If result is POSITIVE SARS-CoV-2 target nucleic acids are DETECTED. The SARS-CoV-2 RNA is generally detectable in upper and lower  respiratory specimens dur ing the acute phase of infection.  Positive  results are indicative of active infection with SARS-CoV-2.  Clinical  correlation with patient history and other diagnostic information is  necessary to determine patient infection status.  Positive results do  not rule out bacterial infection or co-infection with other viruses. If result is PRESUMPTIVE POSTIVE SARS-CoV-2 nucleic acids MAY BE PRESENT.   A presumptive positive result was obtained on the submitted specimen  and confirmed on repeat testing.  While 2019 novel coronavirus  (SARS-CoV-2) nucleic acids may be present in the submitted sample  additional confirmatory testing may be necessary for epidemiological  and / or clinical management purposes  to differentiate between  SARS-CoV-2 and other Sarbecovirus currently known to infect humans.  If clinically indicated additional testing with an alternate test  methodology [phone]) is advised. The SARS-CoV-2 RNA is generally  detectable in upper and lower respiratory sp ecimens during the acute  phase of infection. The expected result is Negative. Fact Sheet for Patients:   BoilerBrush.com.cy Fact Sheet for Healthcare Providers: https://pope.com/ This test is not yet approved or cleared by the [place] FDA and has been authorized for detection and/or diagnosis of SARS-CoV-2 by FDA under an Emergency Use Authorization (EUA).  This EUA will remain in effect (meaning this test can be used) for the duration of the COVID-19 declaration under Section 564(b)(1) of the Act, 21 U.S.C. section 360bbb-3(b)(1), unless the authorization is terminated or revoked sooner. Performed at [facility], [address], [place], [place] [address]   Culture, Urine     Status: Abnormal   Collection Time: 04/08/19  2:27 AM   Specimen: Urine, Clean Catch  Result Value Ref Range Status   Specimen Description   Final    URINE, CLEAN CATCH Performed at [facility], [address], [place], [place] [address]    Special Requests   Final    NONE Performed at [facility], [address], [place], [place] [address]  Culture (A)  Final    30,000 COLONIES/mL MULTIPLE SPECIES PRESENT, SUGGEST RECOLLECTION   Report Status 04/09/2019 FINAL  Final     Labs: BNP (last 3 results) No results for input(s): BNP in the last 8760 hours. Basic Metabolic Panel: Recent Labs  Lab 04/07/19 2214 04/08/19 0500 04/09/19 0316 04/10/19 0550  NA 152* 144 144 140  K 4.1 3.9 3.6 4.6  CL 113* 104 104 103  CO2 27 28 27 23   GLUCOSE 100* 160* 102* 96  BUN 53* 48* 30* 31*  CREATININE 1.17 1.07 1.02 0.99  CALCIUM 10.5* 9.8 9.7 9.4  MG  --   --   --  1.9  PHOS  --  3.4  --  3.2   Liver Function Tests: Recent Labs  Lab 04/07/19 2214 04/08/19 0500 04/09/19 0316  AST 67*  --  50*  ALT 62*  --  50*  ALKPHOS 66  --  59  BILITOT 0.9  --  0.5  PROT 8.1  --  7.3  ALBUMIN 3.5 3.1* 2.8*   No results for input(s): LIPASE, AMYLASE in the last 168 hours. No results for input(s): AMMONIA in  the last 168 hours. CBC: Recent Labs  Lab 04/07/19 2214 04/08/19 0500 04/09/19 0316 04/10/19 0550  WBC 11.5* 9.8 9.1 7.2  NEUTROABS 9.3*  --   --  4.8  HGB 12.6* 11.7* 12.2* 11.8*  HCT 41.4 38.2* 38.7* 38.0*  MCV 91.4 92.7 89.4 90.0  PLT 238 260 298 262   Cardiac Enzymes: No results for input(s): CKTOTAL, CKMB, CKMBINDEX, TROPONINI in the last 168 hours. BNP: Invalid input(s): POCBNP CBG: Recent Labs  Lab 04/08/19 1716  GLUCAP 102*   D-Dimer No results for input(s): DDIMER in the last 72 hours. Hgb A1c No results for input(s): HGBA1C in the last 72 hours. Lipid Profile No results for input(s): CHOL, HDL, LDLCALC, TRIG, CHOLHDL, LDLDIRECT in the last 72 hours. Thyroid function studies No results for input(s): TSH, T4TOTAL, T3FREE, THYROIDAB in the last 72 hours.  Invalid input(s): FREET3 Anemia work up No results for input(s): VITAMINB12, FOLATE, FERRITIN, TIBC, IRON, RETICCTPCT in the last 72 hours. Urinalysis    Component Value Date/Time   COLORURINE YELLOW 04/08/2019 0227   APPEARANCEUR CLOUDY (A) 04/08/2019 0227   LABSPEC 1.025 04/08/2019 0227   PHURINE 6.0 04/08/2019 0227   GLUCOSEU NEGATIVE 04/08/2019 0227   HGBUR SMALL (A) 04/08/2019 0227   BILIRUBINUR NEGATIVE 04/08/2019 0227   KETONESUR TRACE (A) 04/08/2019 0227   PROTEINUR 30 (A) 04/08/2019 0227   NITRITE NEGATIVE 04/08/2019 0227   LEUKOCYTESUR SMALL (A) 04/08/2019 0227   Sepsis Labs Invalid input(s): PROCALCITONIN,  WBC,  LACTICIDVEN Microbiology Recent Results (from the past 240 hour(s))  SARS Coronavirus 2 [facility] order, Performed in [facility] hospital lab) Nasopharyngeal Nasopharyngeal Swab     Status: None   Collection Time: 04/07/19  7:10 PM   Specimen: Nasopharyngeal Swab  Result Value Ref Range Status   SARS Coronavirus 2 NEGATIVE NEGATIVE Final    Comment: (NOTE) If result is NEGATIVE SARS-CoV-2 target nucleic acids are NOT DETECTED. The SARS-CoV-2 RNA is generally detectable in  upper and lower  respiratory specimens during the acute phase of infection. The lowest  concentration of SARS-CoV-2 viral copies this assay can detect is 250  copies / mL. A negative result does not preclude SARS-CoV-2 infection  and should not be used as the sole basis for treatment or other  patient management decisions.  A negative result may occur  with  improper specimen collection / handling, submission of specimen other  than nasopharyngeal swab, presence of viral mutation(s) within the  areas targeted by this assay, and inadequate number of viral copies  (<250 copies / mL). A negative result must be combined with clinical  observations, patient history, and epidemiological information. If result is POSITIVE SARS-CoV-2 target nucleic acids are DETECTED. The SARS-CoV-2 RNA is generally detectable in upper and lower  respiratory specimens dur ing the acute phase of infection.  Positive  results are indicative of active infection with SARS-CoV-2.  Clinical  correlation with patient history and other diagnostic information is  necessary to determine patient infection status.  Positive results do  not rule out bacterial infection or co-infection with other viruses. If result is PRESUMPTIVE POSTIVE SARS-CoV-2 nucleic acids MAY BE PRESENT.   A presumptive positive result was obtained on the submitted specimen  and confirmed on repeat testing.  While 2019 novel coronavirus  (SARS-CoV-2) nucleic acids may be present in the submitted sample  additional confirmatory testing may be necessary for epidemiological  and / or clinical management purposes  to differentiate between  SARS-CoV-2 and other Sarbecovirus currently known to infect humans.  If clinically indicated additional testing with an alternate test  methodology [phone]) is advised. The SARS-CoV-2 RNA is generally  detectable in upper and lower respiratory sp ecimens during the acute  phase of infection. The expected result is  Negative. Fact Sheet for Patients:  BoilerBrush.com.cy Fact Sheet for Healthcare Providers: https://pope.com/ This test is not yet approved or cleared by the [place] FDA and has been authorized for detection and/or diagnosis of SARS-CoV-2 by FDA under an Emergency Use Authorization (EUA).  This EUA will remain in effect (meaning this test can be used) for the duration of the COVID-19 declaration under Section 564(b)(1) of the Act, 21 U.S.C. section 360bbb-3(b)(1), unless the authorization is terminated or revoked sooner. Performed at [facility], [address] [place], [place] [address]   Culture, Urine     Status: Abnormal   Collection Time: 04/08/19  2:27 AM   Specimen: Urine, Clean Catch  Result Value Ref Range Status   Specimen Description   Final    URINE, CLEAN CATCH Performed at [facility], [address], [place], [place] [address]    Special Requests   Final    NONE Performed at [facility], [address] [place], [place] [address]    Culture (A)  Final    30,000 COLONIES/mL MULTIPLE SPECIES PRESENT, SUGGEST RECOLLECTION   Report Status 04/09/2019 FINAL  Final     Time coordinating discharge:  32 minutes  SIGNED:   Dorcas Carrow, MD  [facility] 04/14/2019, 10:06 AM   Addendum: 04/15/2019, 10 AM.  Patient seen and examined and assessed for discharge readiness.  Discharge summary reviewed and no changes needed.

## 2019-04-15 NOTE — TOC Transition Note (Signed)
Transition of Care [facility]) - CM/SW Discharge Note   Patient Details  Name: Raymond Knight MRN: [id number] Date of Birth: 11-21-1945  [facility] CM/SW Contact:  Geralynn Ochs, LCSW Phone Number: 04/15/2019, 1:15 PM   Clinical Narrative:  Nurse to call report to [phone], Room 102A     Final next level of care: Skilled Nursing Facility Barriers to Discharge: Barriers Resolved   Patient Goals and CMS Choice        Discharge Placement              Patient chooses bed at: [facility] Patient to be transferred to facility by: [facility] Name of family member notified: Wife Patient and family notified of of transfer: 04/15/19  Discharge Plan and Services                                     Social Determinants of Health (SDOH) Interventions     Readmission Risk Interventions No flowsheet data found.

## 2019-04-15 NOTE — Progress Notes (Signed)
Patient being discharged to [facility].  Patient to be transported by [facility]. Report given to [place].  No IV to remove.  Discharge paperwork and prescription information given to the patient and placed in his packet for transport.

## 2019-04-15 NOTE — Plan of Care (Signed)
Progressing towards goals

## 2019-04-15 NOTE — Progress Notes (Signed)
Patient seen and examined.  He could not be discharged on 04/14/2019 because of unavailability of COVID results on time for transfer.  Discharge summary reviewed, no changes needed plan of care remain the same. Please discharge to skilled nursing facility today.

## 2019-06-06 DEATH — deceased

## 2019-12-11 IMAGING — MR MR HEAD W/O CM
8 of 10 series · 37 of 48 positions shown · non-contrast
Comparison: CT head 04/07/2019

CLINICAL DATA: Subacute neuro deficit.  Altered mental status.

EXAM:
MRI HEAD WITHOUT CONTRAST
TECHNIQUE: Multiplanar, multiecho pulse sequences of the brain and surrounding
structures were obtained without intravenous contrast.

[Series 3: DWI · axial · 3.0mm · 1.09mm/px · z∈[-62,+94]mm · 8 of 108 slices shown (1 of 4)]
[im 1/108]
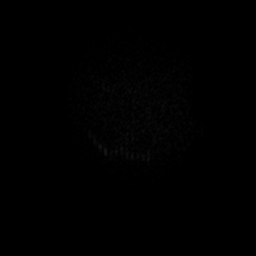
[im 12/108]
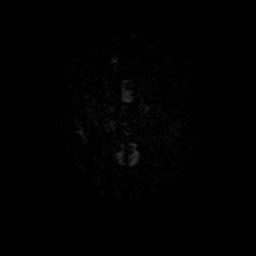
[im 36/108]
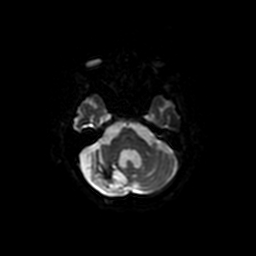
[im 48/108]
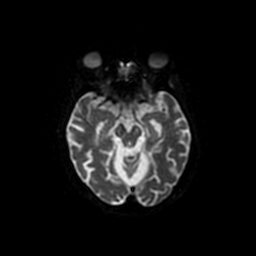
[im 60/108]
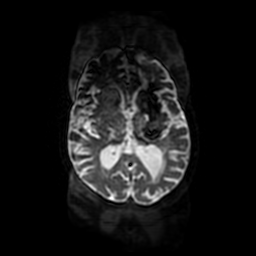
[im 72/108]
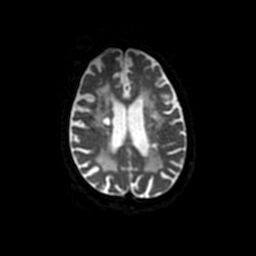
[im 96/108]
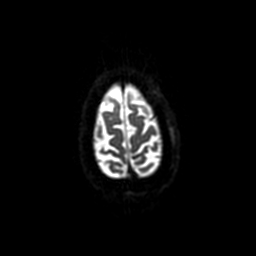
[im 108/108]
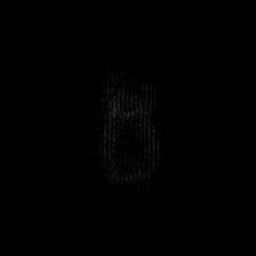

[Series 4: T1 · sagittal · 5.0mm · 0.47mm/px · 2 of 22 slices shown]
[im 1/22]
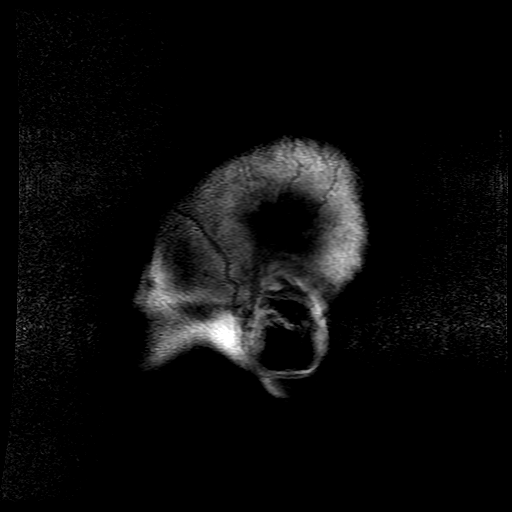
[im 22/22]
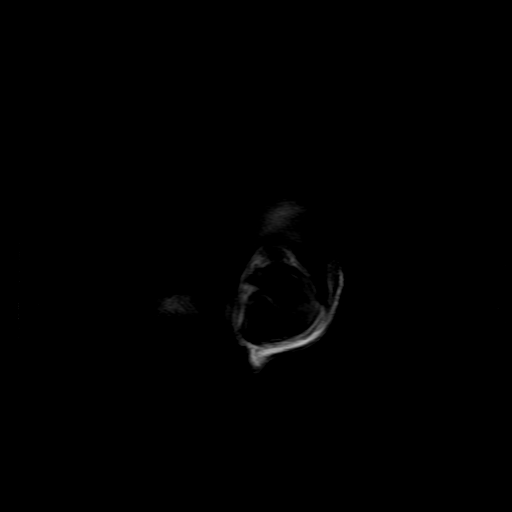

[Series 5: T2 · axial · 5.0mm · 0.43mm/px · z∈[-60,+100]mm · 3 of 28 slices shown (1 of 2)]
[im 1/28]
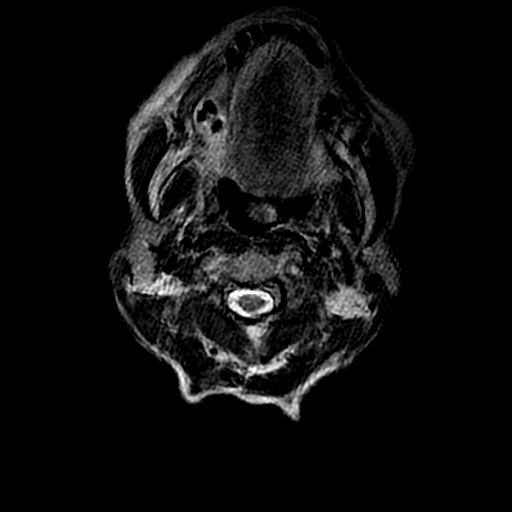
[im 14/28]
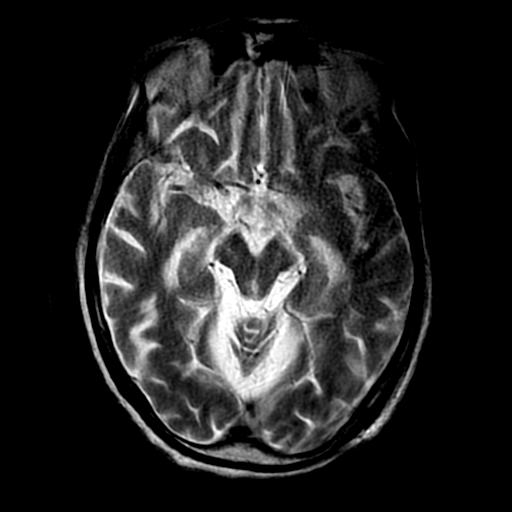
[im 28/28]
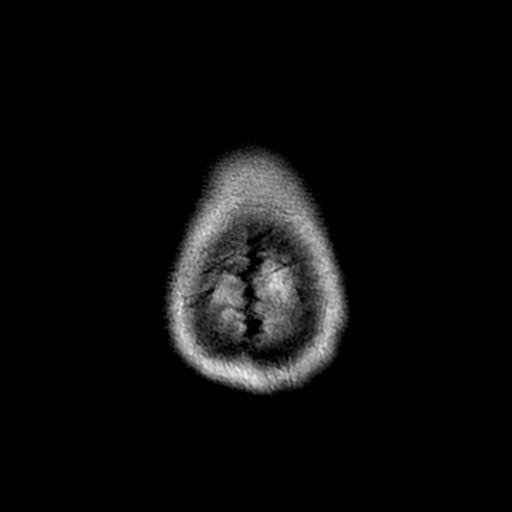

[Series 6: FLAIR · axial · 3.0mm · 0.43mm/px · z∈[-60,+100]mm · 3 of 28 slices shown]
[im 1/28]
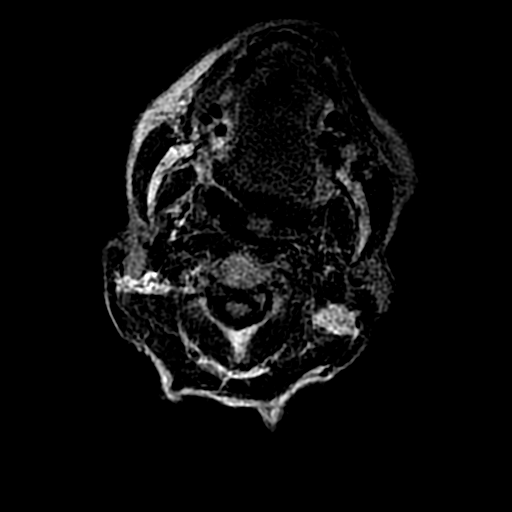
[im 14/28]
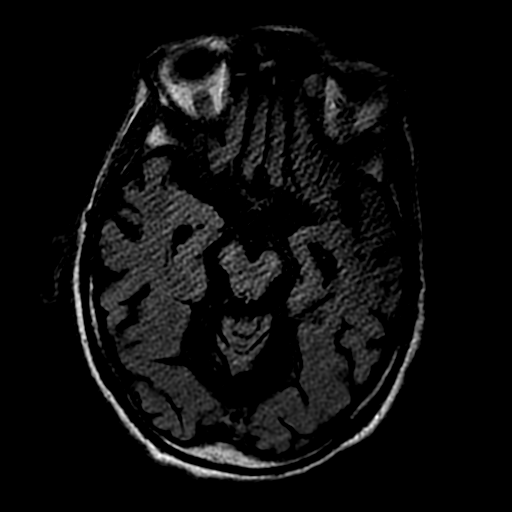
[im 28/28]
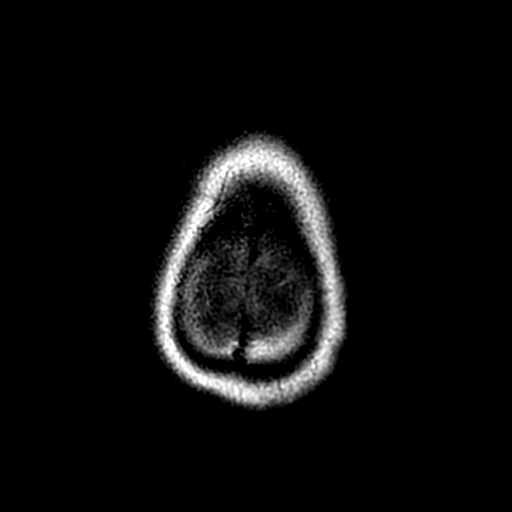

[Series 7: DWI · coronal · 4.0mm · 1.09mm/px · 9 of 90 slices shown (2 of 4)]
[im 1/90]
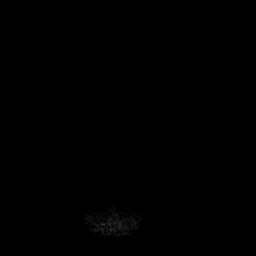
[im 12/90]
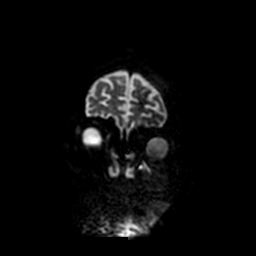
[im 23/90]
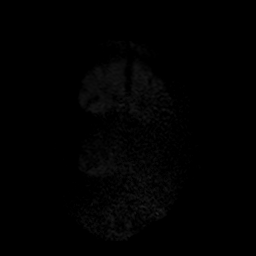
[im 34/90]
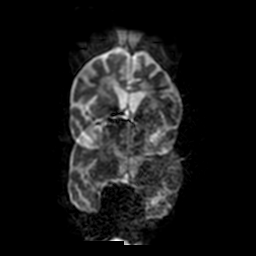
[im 45/90]
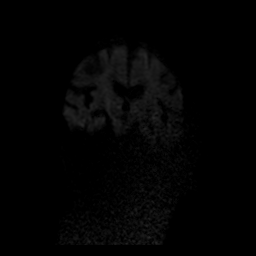
[im 56/90]
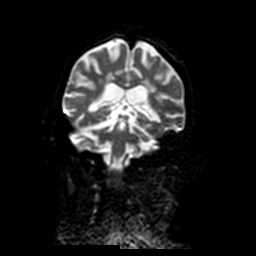
[im 67/90]
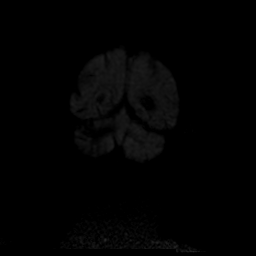
[im 78/90]
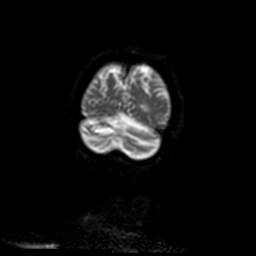
[im 90/90]
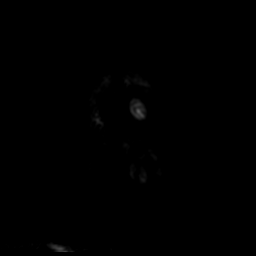

[Series 10: T2 · coronal · 5.0mm · 0.45mm/px · 3 of 29 slices shown (2 of 2)]
[im 1/29]
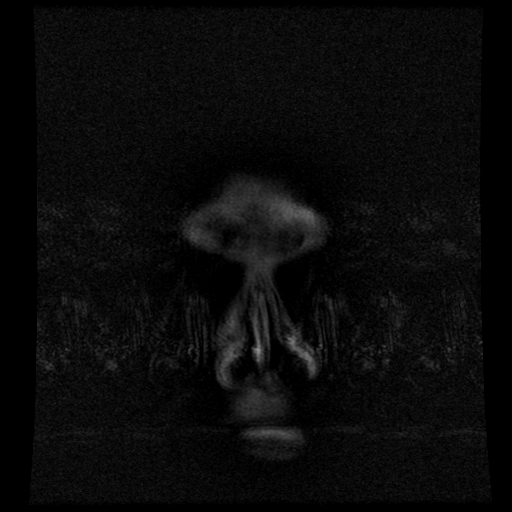
[im 15/29]
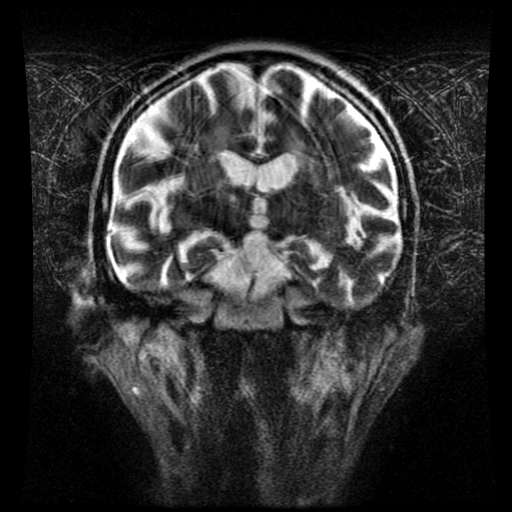
[im 29/29]
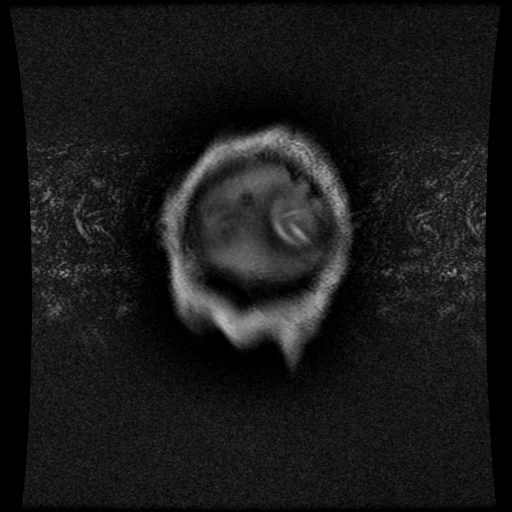

[Series 300: DWI · axial · 3.0mm · 1.09mm/px · z∈[-62,+94]mm · 5 of 54 slices shown (3 of 4)]
[im 1/54]
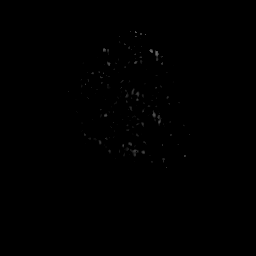
[im 14/54]
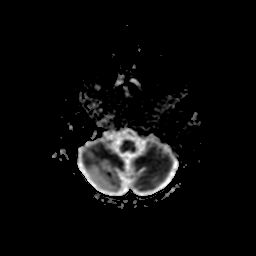
[im 27/54]
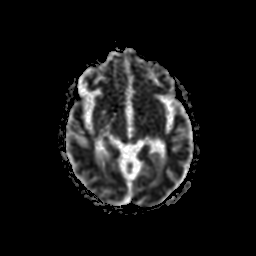
[im 40/54]
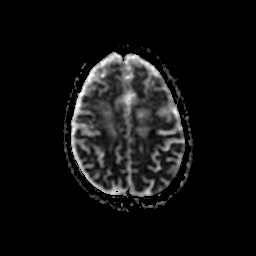
[im 54/54]
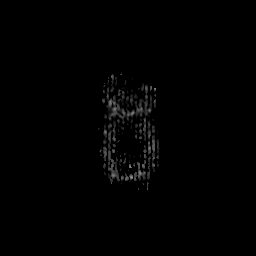

[Series 700: DWI · coronal · 4.0mm · 1.09mm/px · 4 of 45 slices shown (4 of 4)]
[im 1/45]
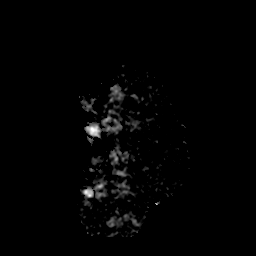
[im 15/45]
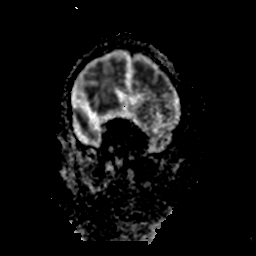
[im 30/45]
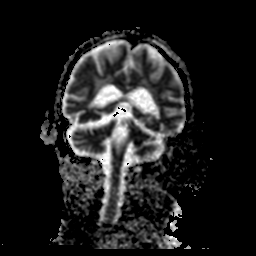
[im 45/45]
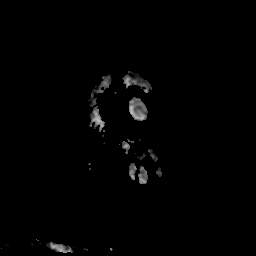

[37 of 48 positions shown; findings below may reference images not displayed]

FINDINGS: Brain: Moderate atrophy. Extensive chronic ischemic changes in the
white matter and thalamus and basal ganglia bilaterally. Chronic
lacunar infarction left pons with mild ischemic change in the right
pons. Chronic hemorrhagic infarct right cerebellum. Mild chronic
ischemia in the left cerebellum. Chronic microhemorrhage left
parietal cortex and left posterior thalamus. Chronic hemorrhage in
the right cerebellar infarct.

Motion degraded study. Possible small area of acute infarct in the
posterior external capsule on the left. This is only seen on axial
diffusion and not confirmed on coronal diffusion.

Vascular: Normal vascular flow voids

Skull and upper cervical spine: Negative

Sinuses/Orbits: Mucosal edema paranasal sinuses. Bilateral cataract
surgery

Other: None
IMPRESSION: Motion degraded study. Possible small acute infarct posterior
external capsule left

Atrophy and advanced chronic ischemic changes as above.
